# Patient Record
Sex: Male | Born: 1985 | Race: Black or African American | Hispanic: No | Marital: Married | Smoking: Never smoker
Health system: Southern US, Community
[De-identification: ages and names within clinical notes are randomized; demographics above are authoritative.]

## PROBLEM LIST (undated history)

## (undated) DIAGNOSIS — J45909 Unspecified asthma, uncomplicated: Secondary | ICD-10-CM

## (undated) DIAGNOSIS — M25871 Other specified joint disorders, right ankle and foot: Secondary | ICD-10-CM

## (undated) DIAGNOSIS — R112 Nausea with vomiting, unspecified: Secondary | ICD-10-CM

## (undated) DIAGNOSIS — Z9889 Other specified postprocedural states: Secondary | ICD-10-CM

## (undated) DIAGNOSIS — M958 Other specified acquired deformities of musculoskeletal system: Secondary | ICD-10-CM

## (undated) DIAGNOSIS — Z98811 Dental restoration status: Secondary | ICD-10-CM

## (undated) HISTORY — PX: WISDOM TOOTH EXTRACTION: SHX21

---
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
## 2006-11-27 ENCOUNTER — Emergency Department (HOSPITAL_COMMUNITY): Admission: EM | Admit: 2006-11-27 | Discharge: 2006-11-27 | Payer: Self-pay | Admitting: Emergency Medicine

## 2013-05-12 HISTORY — PX: ANKLE ARTHROSCOPY: SHX545

## 2014-05-12 HISTORY — PX: SHOULDER ARTHROSCOPY: SHX128

## 2017-01-10 DIAGNOSIS — M958 Other specified acquired deformities of musculoskeletal system: Secondary | ICD-10-CM

## 2017-01-10 DIAGNOSIS — M25871 Other specified joint disorders, right ankle and foot: Secondary | ICD-10-CM

## 2017-01-10 HISTORY — DX: Other specified acquired deformities of musculoskeletal system: M95.8

## 2017-01-10 HISTORY — DX: Other specified joint disorders, right ankle and foot: M25.871

## 2017-01-29 ENCOUNTER — Other Ambulatory Visit: Payer: Self-pay | Admitting: Orthopedic Surgery

## 2017-01-30 ENCOUNTER — Telehealth: Payer: Self-pay

## 2017-01-30 NOTE — Telephone Encounter (Signed)
Pre visit call attempted. Left SMS message for return call.

## 2017-02-02 ENCOUNTER — Encounter: Payer: Self-pay | Admitting: Family Medicine

## 2017-02-02 ENCOUNTER — Ambulatory Visit (INDEPENDENT_AMBULATORY_CARE_PROVIDER_SITE_OTHER): Payer: Managed Care, Other (non HMO) | Admitting: Family Medicine

## 2017-02-02 VITALS — BP 118/74 | HR 71 | Temp 98.5°F | Ht 73.0 in | Wt 270.1 lb

## 2017-02-02 DIAGNOSIS — Z0000 Encounter for general adult medical examination without abnormal findings: Secondary | ICD-10-CM

## 2017-02-02 LAB — LIPID PANEL
CHOL/HDL RATIO: 5
Cholesterol: 187 mg/dL (ref 0–200)
HDL: 35.6 mg/dL — ABNORMAL LOW (ref >39.00)
LDL Cholesterol: 134 mg/dL — ABNORMAL HIGH (ref 0–99)
NonHDL: 150.92
TRIGLYCERIDES: 87 mg/dL (ref 0.0–149.0)
VLDL: 17.4 mg/dL (ref 0.0–40.0)

## 2017-02-02 LAB — COMPREHENSIVE METABOLIC PANEL
ALBUMIN: 4.1 g/dL (ref 3.5–5.2)
ALT: 15 U/L (ref 0–53)
AST: 15 U/L (ref 0–37)
Alkaline Phosphatase: 41 U/L (ref 39–117)
BILIRUBIN TOTAL: 0.5 mg/dL (ref 0.2–1.2)
BUN: 15 mg/dL (ref 6–23)
CALCIUM: 8.9 mg/dL (ref 8.4–10.5)
CO2: 28 mEq/L (ref 19–32)
CREATININE: 1.15 mg/dL (ref 0.40–1.50)
Chloride: 108 mEq/L (ref 96–112)
GFR: 94.39 mL/min (ref >60.00)
Glucose, Bld: 88 mg/dL (ref 70–99)
Potassium: 3.8 mEq/L (ref 3.5–5.1)
Sodium: 144 mEq/L (ref 135–145)
Total Protein: 7.2 g/dL (ref 6.0–8.3)

## 2017-02-02 LAB — CBC
HCT: 43.3 % (ref 39.0–52.0)
HEMOGLOBIN: 14.4 g/dL (ref 13.0–17.0)
MCHC: 33.3 g/dL (ref 30.0–36.0)
MCV: 93.4 fl (ref 78.0–100.0)
PLATELETS: 218 10*3/uL (ref 150.0–400.0)
RBC: 4.63 Mil/uL (ref 4.22–5.81)
RDW: 13.3 % (ref 11.5–15.5)
WBC: 5.8 10*3/uL (ref 4.0–10.5)

## 2017-02-02 NOTE — Progress Notes (Signed)
Pre visit review using our clinic review tool, if applicable. No additional management support is needed unless otherwise documented below in the visit note. 

## 2017-02-02 NOTE — Progress Notes (Signed)
Chief Complaint  Patient presents with  . Establish Care    Well Male Nicholas Montgomery is here for a complete physical.   His last physical was >1 year ago.  Current diet: in general, a "healthy" diet   Current exercise: light lifting, cardio Weight trend: stable Does pt snore? No. Daytime fatigue? No. Seat belt? Yes.    Health maintenance Tetanus- Yes HIV- Yes  Past Medical History:  Diagnosis Date  . Allergy     Past Surgical History:  Procedure Laterality Date  . ANTERIOR CRUCIATE LIGAMENT REPAIR    . OTHER SURGICAL HISTORY  2003   Ankle  . OTHER SURGICAL HISTORY  2016   R shoulder  . OTHER SURGICAL HISTORY  2016   L shoulder  . OTHER SURGICAL HISTORY  2015   R ankle     Medications  Flonase Singulair  Allergies Allergies  Allergen Reactions  . Prednisone Other (See Comments)    Made him emotional  . Toradol [Ketorolac Tromethamine] Anxiety   Family History Family History  Problem Relation Age of Onset  . Hypertension Mother   . Hyperlipidemia Mother   . Fibromyalgia Mother   . Hyperlipidemia Father   . Hypertension Father   . Diabetes Father   . Allergies Daughter   . Allergies Son   . COPD Maternal Grandmother   . Heart disease Maternal Grandmother     Review of Systems: Constitutional:  no unexpected change in weight, no fevers or chills Eye:  no recent significant change in vision Ear/Nose/Mouth/Throat:  Ears:  no tinnitus or hearing loss Nose/Mouth/Throat: +nasal congestion, no ST Cardiovascular:  no chest pain, no palpitations Respiratory:  no cough and no shortness of breath Gastrointestinal:  no abdominal pain, no change in bowel habits, no nausea, vomiting, diarrhea, or constipation and no black or bloody stool GU:  Male: negative for dysuria, frequency, and incontinence and negative for prostate symptoms Musculoskeletal/Extremities:  no pain, redness, or swelling of the joints Integumentary (Skin/Breast): +lesions under R armpit-  otherwise no abnormal skin lesions reported Neurologic:  no headaches, no numbness, tingling Endocrine: No unexpected weight changes Hematologic/Lymphatic:  no abnormal bleeding  Exam BP 118/74 (BP Location: Left Arm, Patient Position: Sitting, Cuff Size: Large)   Pulse 71   Temp 98.5 F (36.9 C) (Oral)   Ht  (1.854 m)   Wt 270 lb 2 oz (122.5 kg)   SpO2 97%   BMI 35.64 kg/m  General:  well developed, well nourished, in no apparent distress Skin: Fleshy lesions appreciated in R axillary region; otherwise no significant moles, warts, or growths Head:  no masses, lesions, or tenderness Eyes:  pupils equal and round, sclera anicteric without injection Ears:  canals without lesions, TMs shiny without retraction, no obvious effusion, no erythema Nose:  nares patent, septum midline, mucosa normal Throat/Pharynx:  lips and gingiva without lesion; tongue and uvula midline; non-inflamed pharynx; no exudates or postnasal drainage Neck: neck supple without adenopathy, thyromegaly, or masses Lungs:  clear to auscultation, breath sounds equal bilaterally, no respiratory distress Cardio:  regular rate and rhythm without murmurs, heart sounds without clicks or rubs Abdomen:  abdomen soft, nontender; bowel sounds normal; no masses or organomegaly Genital (male): declined Rectal: Deferred Musculoskeletal:  symmetrical muscle groups noted without atrophy or deformity Extremities:  no clubbing, cyanosis, or edema, no deformities, no skin discoloration Neuro:  gait normal; deep tendon reflexes normal and symmetric Psych: well oriented with normal range of affect and appropriate judgment/insight  Assessment and  Plan  Well adult exam - Plan: Lipid panel, CBC, Comprehensive metabolic panel   Well 31 y.o. male. Counseled on diet and exercise. He is largely doing well.  Other orders as above. Check w insurance about skin tag removal. Will have him return for removal if he finds out it is  covered. Follow up in 1 year pending the above workup otherwise. The patient voiced understanding and agreement to the plan.  Jilda Roche Fraser, DO 02/02/17 8:39 AM

## 2017-02-02 NOTE — Patient Instructions (Signed)
Check with insurance if they cover painful skin tag removals. If they do, schedule an appt and we will take them off.  Give [place] 2-3 business days to get the results of your labs back. If labs are normal, you will likely receive a letter in the mail unless you have MyChart. This can take longer than 2-3 business days.   Let us know if you need anything.

## 2017-02-05 ENCOUNTER — Encounter (HOSPITAL_BASED_OUTPATIENT_CLINIC_OR_DEPARTMENT_OTHER): Payer: Self-pay | Admitting: *Deleted

## 2017-02-11 NOTE — Progress Notes (Signed)
Arrival time moved up per Dr. Victorino Dike. TM

## 2017-02-12 ENCOUNTER — Encounter (HOSPITAL_BASED_OUTPATIENT_CLINIC_OR_DEPARTMENT_OTHER): Payer: Self-pay | Admitting: Emergency Medicine

## 2017-02-12 ENCOUNTER — Ambulatory Visit (HOSPITAL_BASED_OUTPATIENT_CLINIC_OR_DEPARTMENT_OTHER): Payer: Managed Care, Other (non HMO) | Admitting: Certified Registered"

## 2017-02-12 ENCOUNTER — Encounter (HOSPITAL_BASED_OUTPATIENT_CLINIC_OR_DEPARTMENT_OTHER)
Admission: RE | Disposition: A | Discharge status: Home / Self Care | Payer: Self-pay | Source: Ambulatory Visit | Attending: Orthopedic Surgery

## 2017-02-12 ENCOUNTER — Ambulatory Visit (HOSPITAL_BASED_OUTPATIENT_CLINIC_OR_DEPARTMENT_OTHER)
Admission: RE | Admit: 2017-02-12 | Discharge: 2017-02-12 | Disposition: A | Discharge status: Home / Self Care | Payer: Managed Care, Other (non HMO) | Source: Ambulatory Visit | Attending: Orthopedic Surgery | Admitting: Orthopedic Surgery

## 2017-02-12 DIAGNOSIS — J45909 Unspecified asthma, uncomplicated: Secondary | ICD-10-CM | POA: Diagnosis not present

## 2017-02-12 DIAGNOSIS — M25871 Other specified joint disorders, right ankle and foot: Secondary | ICD-10-CM | POA: Insufficient documentation

## 2017-02-12 DIAGNOSIS — M216X1 Other acquired deformities of right foot: Secondary | ICD-10-CM | POA: Insufficient documentation

## 2017-02-12 DIAGNOSIS — Z79899 Other long term (current) drug therapy: Secondary | ICD-10-CM | POA: Diagnosis not present

## 2017-02-12 DIAGNOSIS — Z888 Allergy status to other drugs, medicaments and biological substances status: Secondary | ICD-10-CM | POA: Insufficient documentation

## 2017-02-12 DIAGNOSIS — M65871 Other synovitis and tenosynovitis, right ankle and foot: Secondary | ICD-10-CM | POA: Insufficient documentation

## 2017-02-12 HISTORY — DX: Other specified joint disorders, right ankle and foot: M25.871

## 2017-02-12 HISTORY — DX: Other specified acquired deformities of musculoskeletal system: M95.8

## 2017-02-12 HISTORY — DX: Other specified postprocedural states: Z98.890

## 2017-02-12 HISTORY — DX: Unspecified asthma, uncomplicated: J45.909

## 2017-02-12 HISTORY — DX: Dental restoration status: Z98.811

## 2017-02-12 HISTORY — PX: ANKLE ARTHROSCOPY WITH DRILLING/MICROFRACTURE: SHX5580

## 2017-02-12 HISTORY — DX: Other specified postprocedural states: R11.2

## 2017-02-12 SURGERY — ARTHROSCOPY, ANKLE, WITH MICROFRACTURE
Anesthesia: General | Site: Ankle | Laterality: Right

## 2017-02-12 MED ORDER — SCOPOLAMINE 1 MG/3DAYS TD PT72
MEDICATED_PATCH | TRANSDERMAL | Status: AC
  Filled 2017-02-12: qty 1

## 2017-02-12 MED ORDER — LIDOCAINE HCL (CARDIAC) 20 MG/ML IV SOLN
INTRAVENOUS | Status: DC | PRN
  Administered 2017-02-12: 60 mg via INTRAVENOUS

## 2017-02-12 MED ORDER — CEFAZOLIN SODIUM-DEXTROSE 2-4 GM/100ML-% IV SOLN
INTRAVENOUS | Status: AC
  Filled 2017-02-12: qty 100

## 2017-02-12 MED ORDER — ONDANSETRON HCL 4 MG/2ML IJ SOLN
INTRAMUSCULAR | Status: DC | PRN
  Administered 2017-02-12: 4 mg via INTRAVENOUS

## 2017-02-12 MED ORDER — PROMETHAZINE HCL 25 MG/ML IJ SOLN: 6.2500 mg | INTRAMUSCULAR | Status: DC | PRN

## 2017-02-12 MED ORDER — FENTANYL CITRATE (PF) 100 MCG/2ML IJ SOLN
INTRAMUSCULAR | Status: AC
  Filled 2017-02-12: qty 2

## 2017-02-12 MED ORDER — DEXTROSE 5 % IV SOLN
3.0000 g | INTRAVENOUS | Status: AC
  Administered 2017-02-12: 3 g via INTRAVENOUS

## 2017-02-12 MED ORDER — FENTANYL CITRATE (PF) 100 MCG/2ML IJ SOLN
50.0000 ug | INTRAMUSCULAR | Status: DC | PRN
  Administered 2017-02-12: 100 ug via INTRAVENOUS

## 2017-02-12 MED ORDER — LACTATED RINGERS IV SOLN
INTRAVENOUS | Status: DC
  Administered 2017-02-12 (×2): via INTRAVENOUS

## 2017-02-12 MED ORDER — FENTANYL CITRATE (PF) 100 MCG/2ML IJ SOLN
25.0000 ug | INTRAMUSCULAR | Status: DC | PRN
  Administered 2017-02-12 (×2): 50 ug via INTRAVENOUS

## 2017-02-12 MED ORDER — BUPIVACAINE-EPINEPHRINE (PF) 0.5% -1:200000 IJ SOLN
INTRAMUSCULAR | Status: DC | PRN
  Administered 2017-02-12: 40 mL via PERINEURAL

## 2017-02-12 MED ORDER — CEFAZOLIN SODIUM-DEXTROSE 1-4 GM/50ML-% IV SOLN
INTRAVENOUS | Status: AC
  Filled 2017-02-12: qty 50

## 2017-02-12 MED ORDER — PROPOFOL 10 MG/ML IV BOLUS
INTRAVENOUS | Status: DC | PRN
  Administered 2017-02-12: 200 mg via INTRAVENOUS

## 2017-02-12 MED ORDER — CEFAZOLIN SODIUM-DEXTROSE 2-4 GM/100ML-% IV SOLN: 2.0000 g | INTRAVENOUS | Status: DC

## 2017-02-12 MED ORDER — MIDAZOLAM HCL 2 MG/2ML IJ SOLN
INTRAMUSCULAR | Status: AC
  Filled 2017-02-12: qty 2

## 2017-02-12 MED ORDER — SODIUM CHLORIDE 0.9 % IR SOLN
Status: DC | PRN
  Administered 2017-02-12: 1000 mL

## 2017-02-12 MED ORDER — SCOPOLAMINE 1 MG/3DAYS TD PT72
1.0000 | MEDICATED_PATCH | Freq: Once | TRANSDERMAL | Status: DC | PRN
  Administered 2017-02-12: 1.5 mg via TRANSDERMAL

## 2017-02-12 MED ORDER — DEXAMETHASONE SODIUM PHOSPHATE 10 MG/ML IJ SOLN
INTRAMUSCULAR | Status: DC | PRN
  Administered 2017-02-12: 5 mg via INTRAVENOUS

## 2017-02-12 MED ORDER — MIDAZOLAM HCL 2 MG/2ML IJ SOLN
1.0000 mg | INTRAMUSCULAR | Status: DC | PRN
  Administered 2017-02-12: 2 mg via INTRAVENOUS

## 2017-02-12 MED ORDER — CHLORHEXIDINE GLUCONATE 4 % EX LIQD: 60.0000 mL | Freq: Once | CUTANEOUS | Status: DC

## 2017-02-12 MED ORDER — SODIUM CHLORIDE 0.9 % IV SOLN: INTRAVENOUS | Status: DC

## 2017-02-12 SURGICAL SUPPLY — 80 items
BANDAGE ESMARK 6X9 LF (GAUZE/BANDAGES/DRESSINGS) IMPLANT
BLADE CUDA 2.0 (BLADE) IMPLANT
BLADE CUDA GRT WHITE 3.5 (BLADE) IMPLANT
BLADE CUDA SHAVER 3.5 (BLADE) IMPLANT
BLADE CUTTER GATOR 3.5 (BLADE) ×3 IMPLANT
BLADE SURG 15 STRL LF DISP TIS (BLADE) ×1 IMPLANT
BLADE SURG 15 STRL SS (BLADE) ×2
BNDG COHESIVE 4X5 TAN STRL (GAUZE/BANDAGES/DRESSINGS) IMPLANT
BNDG COHESIVE 6X5 TAN STRL LF (GAUZE/BANDAGES/DRESSINGS) ×3 IMPLANT
BNDG ESMARK 6X9 LF (GAUZE/BANDAGES/DRESSINGS)
BOOT STEPPER DURA LG (SOFTGOODS) IMPLANT
BOOT STEPPER DURA MED (SOFTGOODS) IMPLANT
BOOT STEPPER DURA SM (SOFTGOODS) IMPLANT
BUR 3.5 LG SPHERICAL (BURR) IMPLANT
BUR CUDA 2.9 (BURR) IMPLANT
BUR CUDA 2.9MM (BURR)
BUR FULL RADIUS 2.9 (BURR) IMPLANT
BUR FULL RADIUS 2.9MM (BURR)
BUR GATOR 2.9 (BURR) IMPLANT
BUR GATOR 2.9MM (BURR)
BUR OVAL 4.0 (BURR) ×3 IMPLANT
BUR SPHERICAL 2.9 (BURR) IMPLANT
BUR SPHERICAL 2.9MM (BURR)
BUR VERTEX HOODED 4.5 (BURR) IMPLANT
BURR 3.5 LG SPHERICAL (BURR)
BURR 3.5MM LG SPHERICAL (BURR)
CHLORAPREP W/TINT 26ML (MISCELLANEOUS) ×3 IMPLANT
CUFF TOURNIQUET SINGLE 34IN LL (TOURNIQUET CUFF) IMPLANT
CUFF TOURNIQUET SINGLE 44IN (TOURNIQUET CUFF) ×3 IMPLANT
DRAPE EXTREMITY T 121X128X90 (DRAPE) ×3 IMPLANT
DRAPE OEC MINIVIEW 54X84 (DRAPES) ×3 IMPLANT
DRAPE U-SHAPE 47X51 STRL (DRAPES) ×3 IMPLANT
DRSG MEPITEL 4X7.2 (GAUZE/BANDAGES/DRESSINGS) ×3 IMPLANT
DRSG PAD ABDOMINAL 8X10 ST (GAUZE/BANDAGES/DRESSINGS) IMPLANT
ELECT REM PT RETURN 9FT ADLT (ELECTROSURGICAL) ×3
ELECTRODE REM PT RTRN 9FT ADLT (ELECTROSURGICAL) ×1 IMPLANT
GAUZE SPONGE 4X4 12PLY STRL (GAUZE/BANDAGES/DRESSINGS) ×3 IMPLANT
GLOVE BIO SURGEON STRL SZ8 (GLOVE) ×3 IMPLANT
GLOVE BIOGEL PI IND STRL 7.0 (GLOVE) ×2 IMPLANT
GLOVE BIOGEL PI IND STRL 8 (GLOVE) ×3 IMPLANT
GLOVE BIOGEL PI INDICATOR 7.0 (GLOVE) ×4
GLOVE BIOGEL PI INDICATOR 8 (GLOVE) ×6
GLOVE ECLIPSE 7.0 STRL STRAW (GLOVE) ×3 IMPLANT
GLOVE ECLIPSE 8.0 STRL XLNG CF (GLOVE) ×6 IMPLANT
GOWN STRL REUS W/ TWL LRG LVL3 (GOWN DISPOSABLE) ×1 IMPLANT
GOWN STRL REUS W/ TWL XL LVL3 (GOWN DISPOSABLE) ×4 IMPLANT
GOWN STRL REUS W/TWL LRG LVL3 (GOWN DISPOSABLE) ×2
GOWN STRL REUS W/TWL XL LVL3 (GOWN DISPOSABLE) ×8
IMPLANT SCP KIT FOOT ANKLE 3 (Orthopedic Implant) ×3 IMPLANT
KIT ACCUFILL 3CC (Orthopedic Implant) ×1 IMPLANT
MANIFOLD NEPTUNE II (INSTRUMENTS) ×3 IMPLANT
PACK ARTHROSCOPY DSU (CUSTOM PROCEDURE TRAY) ×3 IMPLANT
PACK BASIN DAY SURGERY FS (CUSTOM PROCEDURE TRAY) ×3 IMPLANT
PAD CAST 4YDX4 CTTN HI CHSV (CAST SUPPLIES) ×1 IMPLANT
PADDING CAST ABS 4INX4YD NS (CAST SUPPLIES)
PADDING CAST ABS COTTON 4X4 ST (CAST SUPPLIES) IMPLANT
PADDING CAST COTTON 4X4 STRL (CAST SUPPLIES) ×2
PADDING CAST COTTON 6X4 STRL (CAST SUPPLIES) ×3 IMPLANT
PENCIL BUTTON HOLSTER BLD 10FT (ELECTRODE) IMPLANT
SANITIZER HAND PURELL 535ML FO (MISCELLANEOUS) ×3 IMPLANT
SET ARTHROSCOPY TUBING (MISCELLANEOUS) ×2
SET ARTHROSCOPY TUBING LN (MISCELLANEOUS) ×1 IMPLANT
SET IRRIG Y TYPE TUR BLADDER L (SET/KITS/TRAYS/PACK) IMPLANT
SLEEVE SCD COMPRESS KNEE MED (MISCELLANEOUS) ×3 IMPLANT
SPLINT FAST PLASTER 5X30 (CAST SUPPLIES) ×40
SPLINT PLASTER CAST FAST 5X30 (CAST SUPPLIES) ×20 IMPLANT
SPONGE LAP 18X18 X RAY DECT (DISPOSABLE) ×3 IMPLANT
STOCKINETTE 6  STRL (DRAPES) ×2
STOCKINETTE 6 STRL (DRAPES) ×1 IMPLANT
STRAP ANKLE FOOT DISTRACTOR (ORTHOPEDIC SUPPLIES) ×3 IMPLANT
SUT ETHILON 3 0 PS 1 (SUTURE) ×3 IMPLANT
SUT MNCRL AB 3-0 PS2 18 (SUTURE) IMPLANT
SUT VIC AB 0 SH 27 (SUTURE) IMPLANT
SUT VIC AB 2-0 SH 27 (SUTURE)
SUT VIC AB 2-0 SH 27XBRD (SUTURE) IMPLANT
SYR BULB 3OZ (MISCELLANEOUS) IMPLANT
TOWEL OR 17X24 6PK STRL BLUE (TOWEL DISPOSABLE) ×3 IMPLANT
TUBE CONNECTING 20'X1/4 (TUBING)
TUBE CONNECTING 20X1/4 (TUBING) IMPLANT
WATER STERILE IRR 1000ML POUR (IV SOLUTION) ×3 IMPLANT

## 2017-02-12 NOTE — Anesthesia Procedure Notes (Signed)
Anesthesia Regional Block: Popliteal block   Pre-Anesthetic Checklist: ,, timeout performed, Correct Patient, Correct Site, Correct Laterality, Correct Procedure, Correct Position, site marked, Risks and benefits discussed,  Surgical consent,  Pre-op evaluation,  At surgeon's request and post-op pain management  Laterality: Right  Prep: chloraprep       Needles:  Injection technique: Single-shot  Needle Type: Echogenic Needle     Needle Length: 9cm  Needle Gauge: 21     Additional Needles:   Procedures:,,,, ultrasound used (permanent image in chart),,,,  Narrative:  Start time: 02/12/2017 10:54 AM End time: 02/12/2017 11:02 AM Injection made incrementally with aspirations every 5 mL.  Performed by: Personally  Anesthesiologist: Cecile Hearing  Additional Notes: No pain on injection. No increased resistance to injection. Injection made in 5cc increments.  Good needle visualization.  Patient tolerated procedure well.

## 2017-02-12 NOTE — H&P (Signed)
Nicholas Montgomery is an 31 y.o. male.   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  He has failed non op treatment of the right ankle with bracing, activity modification and oral anti-inflammatory medications.  He presents today for right ankle arthroscopy with debridement and treatment of the osteochondral defect.  Past Medical History:  Diagnosis Date  . Allergy-induced asthma    prn inhaler  . Ankle impingement syndrome, right 01/2017  . Dental crowns present   . 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 PONV (postoperative nausea and vomiting)    severe nausea, per pt.    Past Surgical History:  Procedure Laterality Date  . ANKLE ARTHROSCOPY Right 2015  . ANTERIOR CRUCIATE LIGAMENT REPAIR Right 2003  . SHOULDER ARTHROSCOPY Left 2016  . SHOULDER ARTHROSCOPY Right 2016  . WISDOM TOOTH EXTRACTION      Family History  Problem Relation Age of Onset  . Hypertension Mother   . Hyperlipidemia Mother   . Fibromyalgia Mother   . Hyperlipidemia Father   . Hypertension Father   . Diabetes Father   . Allergies Daughter   . Allergies Son   . COPD Maternal Grandmother   . Heart disease Maternal Grandmother    Social History:  reports that he has never smoked. He quit smokeless tobacco use about 4 years ago. He reports that he drinks alcohol. He reports that he does not use drugs.  Allergies:  Allergies  Allergen Reactions  . Prednisone Anxiety  . Toradol [Ketorolac Tromethamine] Anxiety    Medications Prior to Admission  Medication Sig Dispense Refill  . albuterol (PROVENTIL HFA;VENTOLIN HFA) 108 (90 Base) MCG/ACT inhaler Inhale into the lungs every 6 (six) hours as needed for wheezing or shortness of breath.    . fluticasone (FLONASE) 50 MCG/ACT nasal spray Place 2 sprays into both nostrils daily.     Marland Kitchen ibuprofen (ADVIL,MOTRIN) 800 MG tablet Take 800 mg by mouth  every 8 (eight) hours as needed.    . montelukast (SINGULAIR) 10 MG tablet Take 10 mg by mouth at bedtime.      No results found for this or any previous visit (from the past 48 hour(s)). No results found.  ROS  No recent f/c/n/v/wt loss  Blood pressure (!) 133/92, pulse 70, temperature 98.2 F (36.8 C), temperature source Oral, resp. rate (!) 22, height  (1.854 m), weight 122 kg (269 lb), SpO2 100 %. Physical Exam  wn wd male in nad.  A and O x 4.  Mood and affect normal.  EOMI.  resp unlabored.  R ankle with healed surgical incisions and o/w healthy skin.  Sens to LT normal at the foot.  5/5 strength in PF and DF of the ankle and toes.  Brisk cap refill at the toes.  No lymphadenopathy.  Assessment/Plan R ankle synovitis and osteochondral defect of the talus - to OR for operative treatment.  The risks and benefits of the alternative treatment options have been discussed in detail.  The patient wishes to proceed with surgery and specifically understands risks of bleeding, infection, nerve damage, blood clots, need for additional surgery, amputation and death.   Toni Arthurs, MD 03/12/2017, 11:14 AM

## 2017-02-12 NOTE — Discharge Instructions (Signed)
Post Anesthesia Home Care Instructions  Activity: Get plenty of rest for the remainder of the day. A responsible individual must stay with you for 24 hours following the procedure.  For the next 24 hours, DO NOT: -Drive a car -Advertising copywriter -Drink alcoholic beverages -Take any medication unless instructed by your physician -Make any legal decisions or sign important papers.  Meals: Start with liquid foods such as gelatin or soup. Progress to regular foods as tolerated. Avoid greasy, spicy, heavy foods. If nausea and/or vomiting occur, drink only clear liquids until the nausea and/or vomiting subsides. Call your physician if vomiting continues.  Special Instructions/Symptoms: Your throat may feel dry or sore from the anesthesia or the breathing tube placed in your throat during surgery. If this causes discomfort, gargle with warm salt water. The discomfort should disappear within 24 hours.  If you had a scopolamine patch placed behind your ear for the management of post- operative nausea and/or vomiting:  1. The medication in the patch is effective for 72 hours, after which it should be removed.  Wrap patch in a tissue and discard in the trash. Wash hands thoroughly with soap and water. 2. You may remove the patch earlier than 72 hours if you experience unpleasant side effects which may include dry mouth, dizziness or visual disturbances. 3. Avoid touching the patch. Wash your hands with soap and water after contact with the patch.  Toni Arthurs, MD [facility]  Please read the following information regarding your care after surgery.  Medications  You only need a prescription for the narcotic pain medicine (ex. oxycodone, Percocet, Norco).  All of the other medicines listed below are available over the counter. X Aleve 2 pills twice a day for the first 3 days after surgery. X acetominophen (Tylenol) 650 mg every 4-6 hours as you need for minor to moderate pain X  oxycodone as prescribed for severe pain  Narcotic pain medicine (ex. oxycodone, Percocet, Vicodin) will cause constipation.  To prevent this problem, take the following medicines while you are taking any pain medicine. X docusate sodium (Colace) 100 mg twice a day X senna (Senokot) 2 tablets twice a day  X To help prevent blood clots, take a baby aspirin (81 mg) twice a day for two weeks after surgery.  You should also get up every hour while you are awake to move around.    Weight Bearing ? Bear weight when you are able on your operated leg or foot. ? Bear weight only on your operated foot in the post-op shoe. X Do not bear any weight on the operated leg or foot.  Cast / Splint / Dressing X Keep your splint, cast or dressing clean and dry.  Dont put anything (coat hanger, pencil, etc) down inside of it.  If it gets damp, use a hair dryer on the cool setting to dry it.  If it gets soaked, call the office to schedule an appointment for a cast change. ? Remove your dressing 3 days after surgery and cover the incisions with dry dressings.    After your dressing, cast or splint is removed; you may shower, but do not soak or scrub the wound.  Allow the water to run over it, and then gently pat it dry.  Swelling It is normal for you to have swelling where you had surgery.  To reduce swelling and pain, keep your toes above your nose for at least 3 days after surgery.  It may be necessary to keep  your foot or leg elevated for several weeks.  If it hurts, it should be elevated.  Follow Up Call my office at [phone] when you are discharged from the hospital or surgery center to schedule an appointment to be seen two weeks after surgery.  Call my office at [phone] if you develop a fever >101.5 F, nausea, vomiting, bleeding from the surgical site or severe pain.

## 2017-02-12 NOTE — Transfer of Care (Signed)
Immediate Anesthesia Transfer of Care Note  Patient: Nicholas Montgomery  Procedure(s) Performed: ANKLE ARTHROSCOPY WITH EXTENSIVE DEBRIDEMENT AND SUBCHONDROPLASTY (Right Ankle)  Patient Location: PACU  Anesthesia Type:GA combined with regional for post-op pain  Level of Consciousness: awake and patient cooperative  Airway & Oxygen Therapy: Patient Spontanous Breathing and Patient connected to face mask oxygen  Post-op Assessment: Report given to RN and Post -op Vital signs reviewed and stable  Post vital signs: Reviewed and stable  Last Vitals:  Vitals:   02/12/17 1102 02/12/17 1256  BP: (!) 133/92 (P) 135/78  Pulse: 70 (P) 85  Resp: (!) 22 (P) 16  Temp:  (!) (P) 36.3 C  SpO2: 100% (P) 100%    Last Pain:  Vitals:   02/12/17 1102  TempSrc:   PainSc: 0-No pain         Complications: No apparent anesthesia complications

## 2017-02-12 NOTE — Progress Notes (Signed)
Assisted Dr. Turk with right, ultrasound guided, popliteal block. Side rails up, monitors on throughout procedure. See vital signs in flow sheet. Tolerated Procedure well. 

## 2017-02-12 NOTE — Anesthesia Procedure Notes (Signed)
Procedure Name: LMA Insertion Date/Time: 02/12/2017 11:36 AM Performed by: Palmer Fahrner D Pre-anesthesia Checklist: Patient identified, Emergency Drugs available, Suction available and Patient being monitored Patient Re-evaluated:Patient Re-evaluated prior to induction Oxygen Delivery Method: Circle system utilized Preoxygenation: Pre-oxygenation with 100% oxygen Induction Type: IV induction Ventilation: Mask ventilation without difficulty LMA: LMA inserted LMA Size: 4.0 Number of attempts: 1 Airway Equipment and Method: Bite block Placement Confirmation: positive ETCO2 Tube secured with: Tape Dental Injury: Teeth and Oropharynx as per pre-operative assessment

## 2017-02-12 NOTE — Anesthesia Postprocedure Evaluation (Signed)
Anesthesia Post Note  Patient: Nicholas Montgomery  Procedure(s) Performed: ANKLE ARTHROSCOPY WITH EXTENSIVE DEBRIDEMENT AND SUBCHONDROPLASTY (Right Ankle)     Patient location during evaluation: PACU Anesthesia Type: General Level of consciousness: awake and alert Pain management: pain level controlled Vital Signs Assessment: post-procedure vital signs reviewed and stable Respiratory status: spontaneous breathing, nonlabored ventilation and respiratory function stable Cardiovascular status: blood pressure returned to baseline and stable Postop Assessment: no apparent nausea or vomiting Anesthetic complications: no    Last Vitals:  Vitals:   02/12/17 1345 02/12/17 1358  BP:  124/90  Pulse: 70 65  Resp: 15 16  Temp:  36.4 C  SpO2: 98% 98%    Last Pain:  Vitals:   02/12/17 1345  TempSrc:   PainSc: 2                  Cecile Hearing

## 2017-02-12 NOTE — Anesthesia Preprocedure Evaluation (Signed)
Anesthesia Evaluation  Patient identified by MRN, date of birth, ID band Patient awake    Reviewed: Allergy & Precautions, NPO status , Patient's Chart, lab work & pertinent test results  History of Anesthesia Complications (+) PONV and history of anesthetic complications  Airway Mallampati: I  TM Distance: >3 FB Neck ROM: Full    Dental  (+) Teeth Intact, Dental Advisory Given   Pulmonary asthma ,    Pulmonary exam normal breath sounds clear to auscultation       Cardiovascular negative cardio ROS Normal cardiovascular exam Rhythm:Regular Rate:Normal     Neuro/Psych negative neurological ROS  negative psych ROS   GI/Hepatic negative GI ROS, Neg liver ROS,   Endo/Other  negative endocrine ROSObesity   Renal/GU negative Renal ROS     Musculoskeletal negative musculoskeletal ROS (+)   Abdominal   Peds  Hematology negative hematology ROS (+)   Anesthesia Other Findings Day of surgery medications reviewed with the patient.  Reproductive/Obstetrics                             Anesthesia Physical Anesthesia Plan  ASA: II  Anesthesia Plan: General   Post-op Pain Management:  Regional for Post-op pain   Induction: Intravenous  PONV Risk Score and Plan: 3 and Ondansetron, Dexamethasone, Midazolam and Scopolamine patch - Pre-op  Airway Management Planned: LMA  Additional Equipment:   Intra-op Plan:   Post-operative Plan: Extubation in OR  Informed Consent: I have reviewed the patients History and Physical, chart, labs and discussed the procedure including the risks, benefits and alternatives for the proposed anesthesia with the patient or authorized representative who has indicated his/her understanding and acceptance.   Dental advisory given  Plan Discussed with: CRNA  Anesthesia Plan Comments: (Risks/benefits of general anesthesia discussed with patient including risk of  damage to teeth, lips, gum, and tongue, nausea/vomiting, allergic reactions to medications, and the possibility of heart attack, stroke and death.  All patient questions answered.  Patient wishes to proceed.)        Anesthesia Quick Evaluation

## 2017-02-12 NOTE — Brief Op Note (Signed)
02/12/2017  1:08 PM  PATIENT:  Nicholas Montgomery  31 y.o. male  PRE-OPERATIVE DIAGNOSIS:   1.  Right ankle anterior impingement      2.  osteochondral defect right lateral talus       POST-OPERATIVE DIAGNOSIS:  1.  Right ankle anterior impingement      2.  osteochondral defect right lateral talus      3.  Right ankle synovitis  Procedure(s): 1.  Right ANKLE ARTHROSCOPY WITH EXTENSIVE DEBRIDEMENT 2.  Arthroscopic treatment of right talus osteochodral defect  SURGEON:  Toni Arthurs, MD  ASSISTANT: Alfredo Martinez, PA-C  ANESTHESIA:   General, regional  EBL:  minimal   TOURNIQUET:   Total Tourniquet Time Documented: Thigh (Right) - 53 minutes Total: Thigh (Right) - 53 minutes  COMPLICATIONS:  None apparent  DISPOSITION:  Extubated, awake and stable to recovery.  [id number]

## 2017-02-12 NOTE — Op Note (Signed)
[name], [name] NO.:  [id number]  MEDICAL RECORD NO.:  [id number]  LOCATION:                                 FACILITY:  PHYSICIAN:  Toni Arthurs, MD             DATE OF BIRTH:  DATE OF PROCEDURE:  02/12/2017 DATE OF DISCHARGE:                              OPERATIVE REPORT   PREOPERATIVE DIAGNOSES: 1. Right ankle anterior impingement. 2. 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  POSTOPERATIVE DIAGNOSES: 1. Right ankle anterior impingement. 2. 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 3. Right ankle synovitis of the medial, lateral, and anterior gutters.  PROCEDURE: 1. Right ankle arthroscopy with extensive debridement including     medial, lateral, and anterior gutters as well as the impinging     talar osteophyte. 2. Arthroscopic treatment of the right talus osteochondral defect. 3.  AP and lateral radiographs of the right ankle  SURGEON:  Toni Arthurs, MD.  ASSISTANT:  Alfredo Martinez, PA-C.  ANESTHESIA:  General, regional.  ESTIMATED BLOOD LOSS:  Minimal  TOURNIQUET TIME:  53 minutes at 250 mmHg.  COMPLICATIONS:  None apparent.  DISPOSITION:  Extubated, awake, and stable to recovery.  INDICATIONS FOR PROCEDURE:  The patient is a 31 year old male with a history of right ankle pain.  He underwent microfracture of the osteochondral lesion of the lateral talar dome approximately 3 years ago.  He has an impinging osteophyte at the neck of the talus seen on x- ray and on MRI.  He also has persistence of the osteochondral defect with some edema within the lateral talar dome.  He has failed nonoperative treatment to date.  He presents today for ankle arthroscopy and debridement as well as treatment of the osteochondral lesion.  He understands the risks and benefits of the alternative treatment options and elects surgical treatment.  He specifically understands risks of bleeding, infection, nerve damage, blood  clots, need for additional surgery, continued pain, posttraumatic arthritis, amputation, and death.  PROCEDURE IN DETAIL:  After preoperative consent was obtained and the correct operative site was identified, the patient was brought to the operating room and placed supine on the operating table.  General anesthesia was induced.  Preoperative antibiotics were administered. Surgical time-out was taken.  The right lower extremity was prepped and draped in standard sterile fashion with a tourniquet around the thigh. The extremity was positioned in a well leg holder with a traction strap on the right foot.  The extremity was exsanguinated and the tourniquet was inflated to 250 mmHg.  The patient's previous anteromedial arthroscopy portal was identified.  It was opened again sharply and blunt dissection was carried to the level of the joint.  The arthroscopic trocar was inserted and followed by the camera.  The camera was then utilized to establish an anterolateral portal under direct vision using the nick and spread technique.  The joint was then carefully evaluated.  There was significant synovitis noted at the anterolateral and anterior gutter.  The lateral gutter was noted to have an osteochondral lesion of the lateral talar dome with some loose cartilage in the lateral gutter.  The shaver was  used to resect the synovitis at the anterior and lateral gutter and also the loose cartilage at the osteochondral lesion.  The lesion was then probed. There was no other evidence of exposed bone or unstable cartilage. There was good coverage of the lesion with fiber cartilage.  The remainder of the talar dome as well as the tibial plafond were noted to have healthy and intact cartilage.  The posterior gutter was noted to have no synovitis or loose body.  The medial gutter was noted to have no loose body or synovitis.  The anteromedial joint line was noted to have significant synovitis.  This was  resected with the shaver.  The traction strap was then released in order to visualize the anterior gutter.  The osteophyte was identified at the anteromedial neck of the talus.  The arthroscope was switched to the lateral portal.  A bur was placed through the medial portal and was used to resect the osteophyte to the level of the neck of the talus.  The arthroscopic instruments were then removed.  AP and lateral radiographs confirmed the location of the osteochondral lesion.  A guidepin was inserted from the medial process of the talus.  It was advanced to the osteochondral lesion at the subchondral area of the lateral talar dome.  Appropriate positioning of the cannula was noted on AP and lateral radiographs.  Calcium phosphate liquid cement was then inserted through the cannula.  Approximately 2 mL were used to fill in the area of bone marrow edema.  AP and lateral radiographs showed appropriate filling of the defect.  The appropriate amount of time was allowed to elapse.  The cannula was removed.  The arthroscope was inserted back in the ankle joint, and there was no evidence of extrusion of the calcium phosphate into the joint.  Arthroscopic instruments were again removed.  Portals were closed with nylon.  A stab incision was closed with nylon.  Sterile dressings were applied, followed by a well- padded short-leg splint.  Tourniquet was released after application of the dressings at 53 minutes.  The patient was awakened from anesthesia and transported to the recovery room in stable condition.  FOLLOWUP PLAN:  The patient will be nonweightbearing on the right lower extremity for the next 2 weeks.  He will follow up with me for suture removal and to initiate weightbearing in a CAM boot along with active range of motion.  Alfredo Martinez, PA-C, was present and scrubbed for the duration of the case.  His assistance was essential for positioning the patient, prepping and draping, gaining  and maintaining exposure, manipulating the arthroscopic instruments, closing and dressing the wounds, and applying the splint.  RADIOGRAPHS:  AP and lateral radiographs of the right ankle were obtained intraoperatively.  These show appropriate position of the cannula and appropriate filling of the osteochondral defect.  No other acute injuries are noted.  Interval resection of the talar neck osteophyte is also noted.     Toni Arthurs, MD     JH/MEDQ  D:  02/12/2017  T:  02/12/2017  Job:  [id number]

## 2017-02-13 ENCOUNTER — Encounter (HOSPITAL_BASED_OUTPATIENT_CLINIC_OR_DEPARTMENT_OTHER): Payer: Self-pay | Admitting: Orthopedic Surgery

## 2017-04-09 ENCOUNTER — Other Ambulatory Visit: Payer: Self-pay

## 2017-04-09 ENCOUNTER — Encounter: Payer: Self-pay | Admitting: Physical Therapy

## 2017-04-09 ENCOUNTER — Ambulatory Visit: Payer: Managed Care, Other (non HMO) | Attending: Orthopedic Surgery | Admitting: Physical Therapy

## 2017-04-09 DIAGNOSIS — M25571 Pain in right ankle and joints of right foot: Secondary | ICD-10-CM

## 2017-04-09 DIAGNOSIS — M25671 Stiffness of right ankle, not elsewhere classified: Secondary | ICD-10-CM

## 2017-04-09 DIAGNOSIS — R29898 Other symptoms and signs involving the musculoskeletal system: Secondary | ICD-10-CM

## 2017-04-09 DIAGNOSIS — R2689 Other abnormalities of gait and mobility: Secondary | ICD-10-CM | POA: Diagnosis present

## 2017-04-09 NOTE — Therapy (Signed)
[facility] [address]  [address] [place], [place], [address] Phone: [phone]   Fax:  [phone]  Physical Therapy Evaluation  Patient Details  Name: Nicholas Montgomery MRN: [id number] Date of Birth: [birth year] Referring Provider: Alfredo Martinez - [facility]   Encounter Date: 04/09/2017  PT End of Session - 04/09/17 1155    Visit Number  1    Number of Visits  12    Date for PT Re-Evaluation  05/21/17    PT Start Time  1019    PT Stop Time  1102    PT Time Calculation (min)  43 min    Activity Tolerance  Patient tolerated treatment well    Behavior During Therapy  [facility] for tasks assessed/performed       Past Medical History:  Diagnosis Date  . Allergy-induced asthma    prn inhaler  . Ankle impingement syndrome, right 01/2017  . Dental crowns present   . 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 PONV (postoperative nausea and vomiting)    severe nausea, per pt.    Past Surgical History:  Procedure Laterality Date  . ANKLE ARTHROSCOPY Right 2015  . ANKLE ARTHROSCOPY WITH DRILLING/MICROFRACTURE Right 02/12/2017   Procedure: ANKLE ARTHROSCOPY WITH EXTENSIVE DEBRIDEMENT AND SUBCHONDROPLASTY;  Surgeon: Toni Arthurs, MD;  Location: [facility];  Service: Orthopedics;  Laterality: Right;  requests  . ANTERIOR CRUCIATE LIGAMENT REPAIR Right 2003  . SHOULDER ARTHROSCOPY Left 2016  . SHOULDER ARTHROSCOPY Right 2016  . WISDOM TOOTH EXTRACTION      There were no vitals filed for this visit.   Subjective Assessment - 04/09/17 1111    Subjective  Patient [place] ankle [place]. He was in a boot for 8 months following initial injury before having surgery. Following surgery, patient was in a cast for 8 weeks and a boot again. Patient had one visit of PT after coming out of the boot. Since then, patient has adopted an abnormal gait pattern with which he feels he  constantly walks on his heels and is now developing foot pain due to gait pattern. Patient had R ankle arthroscopy in October for removal of a bone spur. Patient doing well since surgery with strength and motion, however still feels he is walking abnormally and would like to correct this as well as improve his strength and balance in order to be able to return to normal sport and gym activities.     Limitations  Standing;Walking    How long can you stand comfortably?  pain immediately    How long can you walk comfortably?  pain immediately    Patient Stated Goals  get back to the gym, running    Currently in Pain?  Yes    Pain Score  2     Pain Location  Ankle    Pain Orientation  Right    Pain Descriptors / Indicators  Aching    Pain Onset  More than a month ago    Pain Frequency  Intermittent    Aggravating Factors   standing, walking    Pain Relieving Factors  ice occassionally          [facility] PT Assessment - 04/09/17 1022      Assessment   Medical Diagnosis  Right ankle arthroscopy    Referring Provider  Alfredo Martinez - [facility]    Onset Date/Surgical Date  02/21/17  Next MD Visit  04/26/2017    Prior Therapy  no      Precautions   Precautions  None      Restrictions   Weight Bearing Restrictions  No      Balance Screen   Has the patient fallen in the past 6 months  No    Has the patient had a decrease in activity level because of a fear of falling?   No    Is the patient reluctant to leave their home because of a fear of falling?   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      Cognition   Overall Cognitive Status   Within Functional Limits for tasks assessed      Sensation   Light Touch  Appears Intact      Coordination   Gross Motor Movements are Fluid and Coordinated  Yes      Functional Tests   Functional tests  Single leg stance;Other;Other2      Single Leg Stance   Comments  L more stable than R; able to stand >10 sec bilaterally      Other:   Other/ Comments  Tandem stance - mild unsteadiness      Other:   Other/Comments  narrow BOS - no issue      Posture/Postural Control   Posture/Postural Control  Postural limitations    Postural Limitations  Rounded Shoulders;Forward head      ROM / Strength   AROM / PROM / Strength  AROM;PROM;Strength      AROM   AROM Assessment Site  Ankle    Right/Left Ankle  Right    Right Ankle Dorsiflexion  0    Right Ankle Plantar Flexion  33    Right Ankle Inversion  35    Right Ankle Eversion  12      PROM   PROM Assessment Site  Ankle    Right/Left Ankle  Right    Right Ankle Dorsiflexion  2    Right Ankle Inversion  40    Right Ankle Eversion  18      Strength   Strength Assessment Site  Hip;Knee;Ankle    Right/Left Hip  Right;Left    Right Hip Flexion  4+/5    Right Hip ABduction  4+/5    Left Hip Flexion  5/5    Left Hip ABduction  5/5    Right/Left Knee  Right;Left    Right Knee Flexion  4+/5    Right Knee Extension  5/5    Left Knee Flexion  5/5    Left Knee Extension  5/5    Right/Left Ankle  Right;Left    Right Ankle Dorsiflexion  4+/5    Left Ankle Dorsiflexion  5/5      Ambulation/Gait   Gait Pattern  Lateral trunk lean to right;Lateral trunk lean to left;Poor foot clearance - right    Gait velocity  3.39ft/sec    Gait Comments  ambulates with increases motion in the frontal plane, does  not fully roll R ankle forward into toe off; tends to walk on lateral border of R foot              Objective measurements completed on examination: See above findings.      [facility]/Exercise - 04/09/17 1022       Exercises   Exercises  Knee/Hip;Ankle      Ankle Exercises: Standing   Heel Raises  10 reps;2 seconds      Ankle Exercises: Seated   Other Seated Ankle Exercises  inversion/eversion; red tband; 10 reps each             PT Education - 04/09/17 1155    Education provided  Yes    Education Details  exam findings, HEP, POC    Person(s) Educated  Patient    Methods  Explanation;Demonstration;Handout    Comprehension  Verbalized understanding;Returned demonstration       PT Short Term Goals - 04/09/17 1204      PT SHORT TERM GOAL #1   Title  patient to be independent with initial HEP    Status  New    Target Date  04/23/17        PT Long Term Goals - 04/09/17 1204      PT LONG TERM GOAL #1   Title  patient to be independent with advanced HEP    Status  New    Target Date  05/21/17      PT LONG TERM GOAL #2   Title  patient to improve LE strength to 5/5 to improve functional mobility    Status  New    Target Date  05/21/17      PT LONG TERM GOAL #3   Title  patient to improve R ankle AROM to 8 deg. DF and 38 deg. PF    Status  New    Target Date  05/21/17      PT LONG TERM GOAL #4   Title  patient to report ability to walk with normal gait pattern for >/20 minutes without pain limiting    Status  New    Target Date  05/21/17      PT LONG TERM GOAL #5   Title  patient to demonstrate/report ability to navigate full flight of stairs with reciprocal stepping pattern and no hand rails without pain limiting    Status  New    Target Date  05/21/17             Plan - 04/09/17 1156    Clinical Impression Statement  Mr. Mccuiston is a 31 y.o male presenting to OPPT s/p R ankle arthroscopy. Patient reported he has been out of a boot for two weeks and MD has advised for no further restrictions regarding weight bearing or activity limitations. Patient has residual abnormal gait pattern from original ankle injury in 2013 that is causing pain in R heel/knee/hip.  Patient reports having pain with walking and all standing activities. Patient demonstrating minor weakness in R LE musculature and mild ROM deficits in the R ankle. Patient would benefit from skilled PT services to address strength and ROM deficits as well as gait abnormalities in order to improve patient functional mobility and QOL.     Clinical Presentation  Stable    Clinical Decision Making  Low    Rehab Potential  Good    PT Frequency  2x / week    PT Duration  6 weeks    PT  Treatment/Interventions  ADLs/Self Care Home Management;Cryotherapy;Electrical Stimulation;Ultrasound;Moist Heat;Iontophoresis 4mg /ml Dexamethasone;Gait training;Stair training;Functional mobility training;Therapeutic activities;Therapeutic exercise;Balance training;Patient/family education;Neuromuscular re-education;Manual techniques;Passive range of motion;Vasopneumatic Device;Taping;Dry needling    Consulted and Agree with Plan of Care  Patient       Patient will benefit from skilled therapeutic intervention in order to improve the following deficits and impairments:  Abnormal gait, Pain, Decreased activity tolerance, Decreased endurance, Decreased range of motion, Decreased strength, Difficulty walking  Visit Diagnosis: Pain in right ankle and joints of right foot  Stiffness of right ankle, not elsewhere classified  Other abnormalities of gait and mobility  Other symptoms and signs involving the musculoskeletal system     Problem List There are no active problems to display for this patient.   Emerson MonteKimberly Leonel Mccollum, SPT 04/09/17 12:56 PM    [facility] [place] [address]  [address] [place], [place], [address] Phone: [phone]   Fax:  [phone]  Name: Nicholas Montgomery MRN: [id number] Date of Birth: 31-May-1985

## 2017-04-09 NOTE — Patient Instructions (Signed)
Gastroc Stretch    Stand with right foot back, leg straight, forward leg bent. Keeping heel on floor, turned slightly out, lean into wall until stretch is felt in calf. Hold __30__ seconds. Repeat 3 times.     Ankle Awareness    Using support for balance, stand with feet shoulder width apart. Rise up on toes. Hold __3__ seconds. Relax. Repeat _15_ times.     Ankle Eversion: Long-Sitting    Loop tubing around feet just below toes, legs separated as far as tolerated, toes pointed inward. Rotate ankles, pointing toes outward. Repeat _10_ times per set. Do _2_ sets per session.     Ankle Inversion: Long-Sitting (Single Leg)    Tubing around forefoot, on same side as anchor, rotate ankle, pointing toes inward. Repeat _10_ times per set. Do _2_ sets per session.

## 2017-04-14 ENCOUNTER — Ambulatory Visit: Payer: Managed Care, Other (non HMO) | Attending: Orthopedic Surgery

## 2017-04-14 DIAGNOSIS — M25571 Pain in right ankle and joints of right foot: Secondary | ICD-10-CM | POA: Insufficient documentation

## 2017-04-14 DIAGNOSIS — R29898 Other symptoms and signs involving the musculoskeletal system: Secondary | ICD-10-CM

## 2017-04-14 DIAGNOSIS — M25671 Stiffness of right ankle, not elsewhere classified: Secondary | ICD-10-CM | POA: Insufficient documentation

## 2017-04-14 DIAGNOSIS — R2689 Other abnormalities of gait and mobility: Secondary | ICD-10-CM | POA: Insufficient documentation

## 2017-04-14 NOTE — Therapy (Signed)
[facility] [place] [address]  [address] [place], [place], [address] Phone: [phone]   Fax:  [phone]  Physical Therapy Treatment  Patient Details  Name: Nicholas Montgomery MRN: [id number] Date of Birth: [birth year] Referring Provider: Alfredo MartinezJustin Ollis - [facility]   Encounter Date: 04/14/2017  PT End of Session - 04/14/17 1541    Visit Number  2    Number of Visits  12    Date for PT Re-Evaluation  05/21/17    PT Start Time  1534    PT Stop Time  1606 pt. having to leave early due to scheduling conflict     PT Time Calculation (min)  32 min    Activity Tolerance  Patient tolerated treatment well    Behavior During Therapy  [facility] for tasks assessed/performed       Past Medical History:  Diagnosis Date  . Allergy-induced asthma    prn inhaler  . Ankle impingement syndrome, right 01/2017  . Dental crowns present   . 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 PONV (postoperative nausea and vomiting)    severe nausea, per pt.    Past Surgical History:  Procedure Laterality Date  . ANKLE ARTHROSCOPY Right 2015  . ANKLE ARTHROSCOPY WITH DRILLING/MICROFRACTURE Right 02/12/2017   Procedure: ANKLE ARTHROSCOPY WITH EXTENSIVE DEBRIDEMENT AND SUBCHONDROPLASTY;  Surgeon: Toni ArthursHewitt, John, MD;  Location: [facility];  Service: Orthopedics;  Laterality: Right;  requests 90mins  . ANTERIOR CRUCIATE LIGAMENT REPAIR Right 2003  . SHOULDER ARTHROSCOPY Left 2016  . SHOULDER ARTHROSCOPY Right 2016  . WISDOM TOOTH EXTRACTION      There were no vitals filed for this visit.  Subjective Assessment - 04/14/17 1537    Subjective  Pt. reporting he has been pain free last few days.  Only ankle pain has been while performing HEP stretch leaning into wall.      Patient Stated Goals  get back to the gym, running    Currently in Pain?  No/denies    Pain Score  0-No pain up to 7/10 with ankle stretching     Multiple Pain Sites  No                       [facility] Adult PT Treatment/Exercise - 04/14/17 1548      Knee/Hip Exercises: Aerobic   Nustep  --      Knee/Hip Exercises: Machines for Strengthening   Cybex Knee Extension  B LE con/R ecc 25# x 10 reps     Cybex Knee Flexion  B LE con/R ecc 35# x 10 reps       Knee/Hip Exercises: Standing   Step Down  Right;10 reps;Step Height: 4";Hand Hold: 1;2 sets    Step Down Limitations  chair     SLS  R SLS with opposite LE toe-touch to cones standing on blue foam oval 2 x 20 sec       Knee/Hip Exercises: Supine   Bridges with Clamshell  Both;10 reps;Strengthening with R hip abduction/ER into green TB at knees at top      Ankle Exercises: Aerobic   Stationary Bike  NuStep: lvl 6, 6 min       Ankle Exercises: Stretches   Soleus Stretch  2 reps;30 seconds    Soleus Stretch Limitations  R    Gastroc Stretch  2 reps;30 seconds    Gastroc Stretch Limitations  R  Ankle Exercises: Seated   Other Seated Ankle Exercises  R ankle IV with red TB x 20 reps     Other Seated Ankle Exercises  B ankle eversion with red looped TB at forefoot x 20 reps               PT Short Term Goals - 04/14/17 1546      PT SHORT TERM GOAL #1   Title  patient to be independent with initial HEP    Status  On-going        PT Long Term Goals - 04/14/17 1547      PT LONG TERM GOAL #1   Title  patient to be independent with advanced HEP    Status  On-going      PT LONG TERM GOAL #2   Title  patient to improve LE strength to 5/5 to improve functional mobility    Status  On-going      PT LONG TERM GOAL #3   Title  patient to improve R ankle AROM to 8 deg. DF and 38 deg. PF    Status  On-going      PT LONG TERM GOAL #4   Title  patient to report ability to walk with normal gait pattern for >/20 minutes without pain limiting    Status  On-going      PT LONG TERM GOAL #5   Title  patient to demonstrate/report ability to navigate full flight of stairs with  reciprocal stepping pattern and no hand rails without pain limiting    Status  On-going            Plan - 04/14/17 1547    Clinical Impression Statement  Nicholas Montgomery doing well today reporting only recent R ankle pain was with R calf stretch with HEP.  This stretch reviewed with pt. today with cues for pt. to decrease intensity of stretch and good tolerance following this.  Tolerated all R LE strengthening activities well and demonstrating moderate challenge with SLS on foam.  Pt. reporting he needs to leave early from treatment in order to get daughter to chess club thus session time limited.  Will monitor response to initiation of strengthening and balance activities in coming visit.      PT Treatment/Interventions  ADLs/Self Care Home Management;Cryotherapy;Electrical Stimulation;Ultrasound;Moist Heat;Iontophoresis 4mg /ml Dexamethasone;Gait training;Stair training;Functional mobility training;Therapeutic activities;Therapeutic exercise;Balance training;Patient/family education;Neuromuscular re-education;Manual techniques;Passive range of motion;Vasopneumatic Device;Taping;Dry needling    Consulted and Agree with Plan of Care  Patient       Patient will benefit from skilled therapeutic intervention in order to improve the following deficits and impairments:  Abnormal gait, Pain, Decreased activity tolerance, Decreased endurance, Decreased range of motion, Decreased strength, Difficulty walking  Visit Diagnosis: Pain in right ankle and joints of right foot  Stiffness of right ankle, not elsewhere classified  Other abnormalities of gait and mobility  Other symptoms and signs involving the musculoskeletal system     Problem List There are no active problems to display for this patient.   Kermit BaloMicah Glennie Bose, PTA 04/14/17 4:25 PM  [facility] [place] [address]  [address] [place], [place], [address] Phone: [phone]   Fax:   [phone]  Name: Nicholas Montgomery MRN: [id number] Date of Birth: [birth year]

## 2017-04-16 ENCOUNTER — Ambulatory Visit: Payer: Managed Care, Other (non HMO) | Admitting: Physical Therapy

## 2017-04-16 ENCOUNTER — Encounter: Payer: Self-pay | Admitting: Physical Therapy

## 2017-04-16 DIAGNOSIS — M25571 Pain in right ankle and joints of right foot: Secondary | ICD-10-CM | POA: Diagnosis not present

## 2017-04-16 DIAGNOSIS — R2689 Other abnormalities of gait and mobility: Secondary | ICD-10-CM

## 2017-04-16 DIAGNOSIS — M25671 Stiffness of right ankle, not elsewhere classified: Secondary | ICD-10-CM

## 2017-04-16 DIAGNOSIS — R29898 Other symptoms and signs involving the musculoskeletal system: Secondary | ICD-10-CM

## 2017-04-16 NOTE — Therapy (Signed)
[facility] [address]  [address] [place], [place], [address] Phone: [phone]   Fax:  [phone]  Physical Therapy Treatment  Patient Details  Name: Nicholas KernDavion Hoge MRN: [id number] Date of Birth: 07-22-1985 Referring Provider: Alfredo MartinezJustin Ollis - [facility]   Encounter Date: 04/16/2017  PT End of Session - 04/16/17 1024    Visit Number  3    Number of Visits  12    Date for PT Re-Evaluation  05/21/17    PT Start Time  0931    PT Stop Time  1016    PT Time Calculation (min)  45 min    Activity Tolerance  Patient tolerated treatment well    Behavior During Therapy  [facility] for tasks assessed/performed       Past Medical History:  Diagnosis Date  . Allergy-induced asthma    prn inhaler  . Ankle impingement syndrome, right 01/2017  . Dental crowns present   . 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 PONV (postoperative nausea and vomiting)    severe nausea, per pt.    Past Surgical History:  Procedure Laterality Date  . ANKLE ARTHROSCOPY Right 2015  . ANKLE ARTHROSCOPY WITH DRILLING/MICROFRACTURE Right 02/12/2017   Procedure: ANKLE ARTHROSCOPY WITH EXTENSIVE DEBRIDEMENT AND SUBCHONDROPLASTY;  Surgeon: Toni ArthursHewitt, John, MD;  Location: [facility];  Service: Orthopedics;  Laterality: Right;  requests 90mins  . ANTERIOR CRUCIATE LIGAMENT REPAIR Right 2003  . SHOULDER ARTHROSCOPY Left 2016  . SHOULDER ARTHROSCOPY Right 2016  . WISDOM TOOTH EXTRACTION      There were no vitals filed for this visit.  Subjective Assessment - 04/16/17 0935    Subjective  Patient reporting no change since last visit. Feels he has been making concious effort to walk differently and some pain in arch of foot.    Currently in Pain?  No/denies    Pain Score  0-No pain                      [facility] Adult PT Treatment/Exercise - 04/16/17 0933      Knee/Hip Exercises: Stretches   Gastroc Stretch  Right;3 reps;30  seconds blue rocker      Knee/Hip Exercises: Standing   Heel Raises  15 reps B con/R ecc; UE support at [employer]    Forward Step Up  Right;2 sets;10 reps;Step Height: 6"    Wall Squat  15 reps;Limitations green band around knees    Wall Squat Limitations  cueing to prevent weight shift to L    Other Standing Knee Exercises  hip drop; 2 x 10; 6" step; UE support cueing to keep R knee straight      Ankle Exercises: Aerobic   Stationary Bike  NuStep: lvl 6, 6 min       Ankle Exercises: Seated   BAPS  Sitting;10 reps 5# on board; forward, inversion, eversion      Ankle Exercises: Plyometrics   Plyometric Exercises  2 x 20 forward/lateral line jumps    Plyometric Exercises  ladder drills; high knees 2 feet each box; 2 laps each               PT Short Term Goals - 04/14/17 1546      PT SHORT TERM GOAL #1   Title  patient to be independent with initial HEP    Status  On-going        PT Long Term Goals - 04/14/17  1547      PT LONG TERM GOAL #1   Title  patient to be independent with advanced HEP    Status  On-going      PT LONG TERM GOAL #2   Title  patient to improve LE strength to 5/5 to improve functional mobility    Status  On-going      PT LONG TERM GOAL #3   Title  patient to improve R ankle AROM to 8 deg. DF and 38 deg. PF    Status  On-going      PT LONG TERM GOAL #4   Title  patient to report ability to walk with normal gait pattern for >/20 minutes without pain limiting    Status  On-going      PT LONG TERM GOAL #5   Title  patient to demonstrate/report ability to navigate full flight of stairs with reciprocal stepping pattern and no hand rails without pain limiting    Status  On-going            Plan - 04/16/17 1025    Clinical Impression Statement  Patient progressing well with all activities and able to tolerate progression of exercises to include mild impact on R ankle. Attemped bilateral hopping during session today for first time since surgery  with patient tolerating well. Continuing to work on equal weight bearing between LE during squatting and ambulation as patient continuing to demonstrate heel strike on lateral border of R foot and vaulting on R LE. Will progress upcoming sessions to patient tolerance.    PT Treatment/Interventions  ADLs/Self Care Home Management;Cryotherapy;Electrical Stimulation;Ultrasound;Moist Heat;Iontophoresis 4mg /ml Dexamethasone;Gait training;Stair training;Functional mobility training;Therapeutic activities;Therapeutic exercise;Balance training;Patient/family education;Neuromuscular re-education;Manual techniques;Passive range of motion;Vasopneumatic Device;Taping;Dry needling    Consulted and Agree with Plan of Care  Patient       Patient will benefit from skilled therapeutic intervention in order to improve the following deficits and impairments:  Abnormal gait, Pain, Decreased activity tolerance, Decreased endurance, Decreased range of motion, Decreased strength, Difficulty walking  Visit Diagnosis: Pain in right ankle and joints of right foot  Stiffness of right ankle, not elsewhere classified  Other abnormalities of gait and mobility  Other symptoms and signs involving the musculoskeletal system     Problem List There are no active problems to display for this patient.    Emerson MonteKimberly Kailana Benninger, SPT 04/16/17 10:31 AM    [facility] [place] [address]  [address] [place], [place], [address] Phone: [phone]   Fax:  [phone]  Name: Nicholas KernDavion Mclean MRN: [id number] Date of Birth: [birth year]

## 2017-04-21 ENCOUNTER — Ambulatory Visit: Payer: Managed Care, Other (non HMO) | Admitting: Physical Therapy

## 2017-04-23 ENCOUNTER — Ambulatory Visit: Payer: Managed Care, Other (non HMO)

## 2017-04-23 DIAGNOSIS — R29898 Other symptoms and signs involving the musculoskeletal system: Secondary | ICD-10-CM

## 2017-04-23 DIAGNOSIS — M25671 Stiffness of right ankle, not elsewhere classified: Secondary | ICD-10-CM

## 2017-04-23 DIAGNOSIS — R2689 Other abnormalities of gait and mobility: Secondary | ICD-10-CM

## 2017-04-23 DIAGNOSIS — M25571 Pain in right ankle and joints of right foot: Secondary | ICD-10-CM | POA: Diagnosis not present

## 2017-04-23 NOTE — Therapy (Signed)
[facility] [address]  [address] [place], [place], [address] Phone: [phone]   Fax:  [phone]  Physical Therapy Treatment  Patient Details  Name: Nicholas Montgomery MRN: [id number] Date of Birth: 28-Nov-1985 Referring Provider: Alfredo MartinezJustin Ollis - [facility]   Encounter Date: 04/23/2017  PT End of Session - 04/23/17 0834    Visit Number  4    Number of Visits  12    Date for PT Re-Evaluation  05/21/17    PT Start Time  0814 pt. arrived late     PT Stop Time  0842    PT Time Calculation (min)  28 min    Activity Tolerance  Patient tolerated treatment well    Behavior During Therapy  [facility] for tasks assessed/performed       Past Medical History:  Diagnosis Date  . Allergy-induced asthma    prn inhaler  . Ankle impingement syndrome, right 01/2017  . Dental crowns present   . 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 PONV (postoperative nausea and vomiting)    severe nausea, per pt.    Past Surgical History:  Procedure Laterality Date  . ANKLE ARTHROSCOPY Right 2015  . ANKLE ARTHROSCOPY WITH DRILLING/MICROFRACTURE Right 02/12/2017   Procedure: ANKLE ARTHROSCOPY WITH EXTENSIVE DEBRIDEMENT AND SUBCHONDROPLASTY;  Surgeon: Toni ArthursHewitt, John, MD;  Location: [facility];  Service: Orthopedics;  Laterality: Right;  requests 90mins  . ANTERIOR CRUCIATE LIGAMENT REPAIR Right 2003  . SHOULDER ARTHROSCOPY Left 2016  . SHOULDER ARTHROSCOPY Right 2016  . WISDOM TOOTH EXTRACTION      There were no vitals filed for this visit.  Subjective Assessment - 04/23/17 0818    Subjective  Pt. doing well today.  Reports having less pain in arch of foot with walking.      Patient Stated Goals  get back to the gym, running    Currently in Pain?  No/denies    Pain Score  0-No pain    Multiple Pain Sites  No                      [facility] Adult PT Treatment/Exercise - 04/23/17 0819      Neuro Re-ed    Neuro  Re-ed Details   on blue foam balance beam: 12 cone nock over/put back; no UE support       Knee/Hip Exercises: Stretches   Gastroc Stretch  1 rep;30 seconds;Right      Knee/Hip Exercises: Aerobic   Recumbent Bike  Lvl 2, 4 min       Knee/Hip Exercises: Machines for Strengthening   Cybex Knee Extension  B LE con/R ecc 25# x 15 reps     Cybex Knee Flexion  B LE con/R ecc 35# x 15 reps       Knee/Hip Exercises: Standing   Step Down  Step Height: 8";10 reps;Hand Hold: 1;Right    Step Down Limitations  focusing on eccentric control     SLS  R shallow RDL with UE touch to mat table x 5 reps; difficulty     SLS with Vectors  R SLS with opposite LE toe-touch to rubber bubbles (front, side, back) 3 x 5 reps; light UE suppor t    Other Standing Knee Exercises  BOSU ball side side rocking (down) x 20 reps each way  no UE support     Other Standing Knee Exercises  Alternating lunge onto BOSU ball (up)  x 15 reps; 1 ski pole                PT Short Term Goals - 04/23/17 1235      PT SHORT TERM GOAL #1   Title  patient to be independent with initial HEP    Status  Achieved        PT Long Term Goals - 04/14/17 1547      PT LONG TERM GOAL #1   Title  patient to be independent with advanced HEP    Status  On-going      PT LONG TERM GOAL #2   Title  patient to improve LE strength to 5/5 to improve functional mobility    Status  On-going      PT LONG TERM GOAL #3   Title  patient to improve R ankle AROM to 8 deg. DF and 38 deg. PF    Status  On-going      PT LONG TERM GOAL #4   Title  patient to report ability to walk with normal gait pattern for >/20 minutes without pain limiting    Status  On-going      PT LONG TERM GOAL #5   Title  patient to demonstrate/report ability to navigate full flight of stairs with reciprocal stepping pattern and no hand rails without pain limiting    Status  On-going            Plan - 04/23/17 0845    Clinical Impression Statement  Pt.  reporting he purchased Prostretch DF rocker and has been using this at home without issue.  Reports consistent HEP performance.  Tolerated additional balance/stability activities on compliant surface today well.  Most difficulty today with R RDL with limited control.  Seems to be tolerating activities in treatment well and progressing toward goals.    PT Treatment/Interventions  ADLs/Self Care Home Management;Cryotherapy;Electrical Stimulation;Ultrasound;Moist Heat;Iontophoresis 4mg /ml Dexamethasone;Gait training;Stair training;Functional mobility training;Therapeutic activities;Therapeutic exercise;Balance training;Patient/family education;Neuromuscular re-education;Manual techniques;Passive range of motion;Vasopneumatic Device;Taping;Dry needling    Consulted and Agree with Plan of Care  Patient       Patient will benefit from skilled therapeutic intervention in order to improve the following deficits and impairments:  Abnormal gait, Pain, Decreased activity tolerance, Decreased endurance, Decreased range of motion, Decreased strength, Difficulty walking  Visit Diagnosis: Pain in right ankle and joints of right foot  Stiffness of right ankle, not elsewhere classified  Other abnormalities of gait and mobility  Other symptoms and signs involving the musculoskeletal system     Problem List There are no active problems to display for this patient.   Kermit BaloMicah Clydie Dillen, PTA 04/23/17 12:42 PM  [facility] [address]  [address] [place], [place], [address] Phone: [phone]   Fax:  [phone]  Name: Nicholas Montgomery MRN: [id number] Date of Birth: 1986/02/27

## 2017-04-28 ENCOUNTER — Ambulatory Visit: Payer: Managed Care, Other (non HMO)

## 2017-04-28 DIAGNOSIS — M25571 Pain in right ankle and joints of right foot: Secondary | ICD-10-CM

## 2017-04-28 DIAGNOSIS — M25671 Stiffness of right ankle, not elsewhere classified: Secondary | ICD-10-CM

## 2017-04-28 DIAGNOSIS — R2689 Other abnormalities of gait and mobility: Secondary | ICD-10-CM

## 2017-04-28 DIAGNOSIS — R29898 Other symptoms and signs involving the musculoskeletal system: Secondary | ICD-10-CM

## 2017-04-28 NOTE — Therapy (Signed)
[facility] [address]  [address] [place], [place], [address] Phone: [phone]   Fax:  [phone]  Physical Therapy Treatment  Patient Details  Name: Nicholas Montgomery MRN: [id number] Date of Birth: [birth year] Referring Provider: Alfredo MartinezJustin Ollis - [facility]   Encounter Date: 04/28/2017  PT End of Session - 04/28/17 1406    Visit Number  5    Number of Visits  12    Date for PT Re-Evaluation  05/21/17    PT Start Time  1404    PT Stop Time  1448    PT Time Calculation (min)  44 min    Activity Tolerance  Patient tolerated treatment well    Behavior During Therapy  [facility]FL for tasks assessed/performed       Past Medical History:  Diagnosis Date  . Allergy-induced asthma    prn inhaler  . Ankle impingement syndrome, right 01/2017  . Dental crowns present   . 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 PONV (postoperative nausea and vomiting)    severe nausea, per pt.    Past Surgical History:  Procedure Laterality Date  . ANKLE ARTHROSCOPY Right 2015  . ANKLE ARTHROSCOPY WITH DRILLING/MICROFRACTURE Right 02/12/2017   Procedure: ANKLE ARTHROSCOPY WITH EXTENSIVE DEBRIDEMENT AND SUBCHONDROPLASTY;  Surgeon: Toni ArthursHewitt, John, MD;  Location: [facility];  Service: Orthopedics;  Laterality: Right;  requests 90mins  . ANTERIOR CRUCIATE LIGAMENT REPAIR Right 2003  . SHOULDER ARTHROSCOPY Left 2016  . SHOULDER ARTHROSCOPY Right 2016  . WISDOM TOOTH EXTRACTION      There were no vitals filed for this visit.  Subjective Assessment - 04/28/17 1406    Subjective  Pt. reporting he felt good following last visit.      Limitations  Standing;Walking    Patient Stated Goals  get back to the gym, running    Currently in Pain?  No/denies    Pain Score  0-No pain    Multiple Pain Sites  No                      [facility] Adult PT Treatment/Exercise - 04/28/17 1408      Knee/Hip Exercises: Stretches   Gastroc Stretch  1 rep;30 seconds;Right prostretch       Knee/Hip Exercises: Aerobic   Recumbent Bike  Lvl 4, 6 min       Knee/Hip Exercises: Machines for Strengthening   Cybex Knee Extension  B LE con/R ecc 35# x 15 reps     Cybex Knee Flexion  B LE con/R ecc 45# x 15 reps     Cybex Leg Press  R LE only: 35# x 20 reps       Knee/Hip Exercises: Standing   Heel Raises  15 reps    Heel Raises Limitations  B con/R ecc  UBE    Step Down  Step Height: 8";10 reps;Hand Hold: 1;Right    Step Down Limitations  heel touch; 1 ski pole    Wall Squat  10 reps;3 seconds    Wall Squat Limitations  Alternating LAQ kick outs difficulty with this     SLS  --    Other Standing Knee Exercises  BOSU ball (down) side side rocking x 20 reps each way; (down) double leg stance with ball toss x 1 min; R step up onto BOSU ball (up) x 10 reps; 1 ski pole support       Other Standing Knee  Exercises  Split squat with R LE forward and L LE elevated on mat table x 10 reps      Ankle Exercises: Standing   Heel Walk (Round Trip)  60 ft     Toe Walk (Round Trip)  60 ft     Other Standing Ankle Exercises  Monster walk, side stepping with green TB at forefoot 2 x 30 ft              PT Education - 04/28/17 1452    Education provided  Yes    Education Details  Monster walk with green TB at forefoot, side stepping with green TB at forefoot     Person(s) Educated  Patient    Methods  Explanation;Demonstration;Verbal cues;Handout    Comprehension  Verbalized understanding;Returned demonstration;Verbal cues required;Need further instruction       PT Short Term Goals - 04/23/17 1235      PT SHORT TERM GOAL #1   Title  patient to be independent with initial HEP    Status  Achieved        PT Long Term Goals - 04/14/17 1547      PT LONG TERM GOAL #1   Title  patient to be independent with advanced HEP    Status  On-going      PT LONG TERM GOAL #2   Title  patient to improve LE strength to 5/5 to improve  functional mobility    Status  On-going      PT LONG TERM GOAL #3   Title  patient to improve R ankle AROM to 8 deg. DF and 38 deg. PF    Status  On-going      PT LONG TERM GOAL #4   Title  patient to report ability to walk with normal gait pattern for >/20 minutes without pain limiting    Status  On-going      PT LONG TERM GOAL #5   Title  patient to demonstrate/report ability to navigate full flight of stairs with reciprocal stepping pattern and no hand rails without pain limiting    Status  On-going            Plan - 04/28/17 1407    Clinical Impression Statement  Nicholas Montgomery tolerated progression of single-leg-stance stability activities well today without pain however did report fatigue following treatment.  Most difficulty with single leg BOSU ball balance and alternating kick out with wall sit.  HEP updated today with additional hip/ankle strengthening activities.  Progressing well toward goals and reports daily performance of Prostretch rocker and full HEP.       PT Treatment/Interventions  ADLs/Self Care Home Management;Cryotherapy;Electrical Stimulation;Ultrasound;Moist Heat;Iontophoresis 4mg /ml Dexamethasone;Gait training;Stair training;Functional mobility training;Therapeutic activities;Therapeutic exercise;Balance training;Patient/family education;Neuromuscular re-education;Manual techniques;Passive range of motion;Vasopneumatic Device;Taping;Dry needling    Consulted and Agree with Plan of Care  Patient       Patient will benefit from skilled therapeutic intervention in order to improve the following deficits and impairments:  Abnormal gait, Pain, Decreased activity tolerance, Decreased endurance, Decreased range of motion, Decreased strength, Difficulty walking  Visit Diagnosis: Pain in right ankle and joints of right foot  Stiffness of right ankle, not elsewhere classified  Other abnormalities of gait and mobility  Other symptoms and signs involving the  musculoskeletal system     Problem List There are no active problems to display for this patient.   Kermit Balo, PTA 04/28/17 3:18 PM  [facility] [address]  [address] [place], [place], [address] Phone: [phone]   Fax:  [phone]  Name: Nicholas Montgomery MRN: [id number] Date of Birth: [birth year]

## 2017-04-29 ENCOUNTER — Other Ambulatory Visit (INDEPENDENT_AMBULATORY_CARE_PROVIDER_SITE_OTHER): Payer: Self-pay | Admitting: Otolaryngology

## 2017-04-29 DIAGNOSIS — J329 Chronic sinusitis, unspecified: Secondary | ICD-10-CM

## 2017-04-30 ENCOUNTER — Ambulatory Visit: Payer: Managed Care, Other (non HMO)

## 2017-04-30 DIAGNOSIS — M25571 Pain in right ankle and joints of right foot: Secondary | ICD-10-CM

## 2017-04-30 DIAGNOSIS — R2689 Other abnormalities of gait and mobility: Secondary | ICD-10-CM

## 2017-04-30 DIAGNOSIS — R29898 Other symptoms and signs involving the musculoskeletal system: Secondary | ICD-10-CM

## 2017-04-30 DIAGNOSIS — M25671 Stiffness of right ankle, not elsewhere classified: Secondary | ICD-10-CM

## 2017-04-30 NOTE — Therapy (Addendum)
[facility] [address]  [place], [place], [address] Phone: [phone]   Fax:  [phone]  Physical Therapy Treatment  Patient Details  Name: Nicholas Montgomery MRN: [id number] Date of Birth: [birth year] Referring Provider: Mechele Claude - [facility]   Encounter Date: 04/30/2017  PT End of Session - 04/30/17 0936    Visit Number  6    Number of Visits  12    Date for PT Re-Evaluation  05/21/17    PT Start Time  0932    PT Stop Time  1021 10 min ice pack to end treatment     PT Time Calculation (min)  49 min    Activity Tolerance  Patient tolerated treatment well    Behavior During Therapy  [facility] for tasks assessed/performed       Past Medical History:  Diagnosis Date  . Allergy-induced asthma    prn inhaler  . Ankle impingement syndrome, right 01/2017  . Dental crowns present   . 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 PONV (postoperative nausea and vomiting)    severe nausea, per pt.    Past Surgical History:  Procedure Laterality Date  . ANKLE ARTHROSCOPY Right 2015  . ANKLE ARTHROSCOPY WITH DRILLING/MICROFRACTURE Right 02/12/2017   Procedure: ANKLE ARTHROSCOPY WITH EXTENSIVE DEBRIDEMENT AND SUBCHONDROPLASTY;  Surgeon: Wylene Simmer, MD;  Location: [facility];  Service: Orthopedics;  Laterality: Right;  requests 41mns  . ANTERIOR CRUCIATE LIGAMENT REPAIR Right 2003  . SHOULDER ARTHROSCOPY Left 2016  . SHOULDER ARTHROSCOPY Right 2016  . WISDOM TOOTH EXTRACTION      There were no vitals filed for this visit.  Subjective Assessment - 04/30/17 0935    Subjective  Pt. reporting, some R ankle swelling yesterday and has been having some "sharp" R buttocks pains since last visit when walking.      Patient Stated Goals  get back to the gym, running    Currently in Pain?  No/denies    Pain Score  0-No pain    Multiple Pain Sites  No                      [facility] Adult PT  Treatment/Exercise - 04/30/17 0941      Knee/Hip Exercises: Stretches   Piriformis Stretch  Right;2 reps;30 seconds    Piriformis Stretch Limitations  Figure-4, mod piriformis       Knee/Hip Exercises: Aerobic   Recumbent Bike  Lvl 4, 6 min       Knee/Hip Exercises: Standing   Hip Flexion  Left;10 reps;Knee straight;Stengthening    Hip Flexion Limitations  red TB at ankle; no UE support     Hip ADduction  Left;10 reps;Strengthening    Hip ADduction Limitations  red TB at ankle; no UE support     Hip Abduction  Left;10 reps;Knee straight;Stengthening    Abduction Limitations  red TB at ankle; no UE support     Hip Extension  Left;10 reps;Knee straight;Stengthening    Extension Limitations  red TB at ankle; no UE support       Modalities   Modalities  Cryotherapy      Cryotherapy   Number Minutes Cryotherapy  10 Minutes    Cryotherapy Location  Knee R    Type of Cryotherapy  Ice pack      Manual Therapy   Manual Therapy  Passive ROM;Soft tissue mobilization    Manual therapy comments  supine     Soft tissue mobilization  retrograde massage to R ankle to decrease swelling  no tenderness however noted swelling throughout ankle     Passive ROM  Manual R glute, piriformis x 30 sec       Ankle Exercises: Seated   Towel Crunch  5 reps    Towel Crunch Weights (lbs)  2.5#    BAPS  Sitting;10 reps;Level 3 7.5#; Side<>side, forward/backwards, CW, CCW x 10 reps     Other Seated Ankle Exercises  --    Other Seated Ankle Exercises  Seated R towel curls for instrinsic foot 2.5# on towel x 5 reps              PT Education - 04/30/17 1152    Education provided  Yes    Education Details  supine figure-4 stretch, seated piriformis stretch     Person(s) Educated  Patient    Methods  Explanation;Demonstration;Verbal cues;Handout    Comprehension  Verbalized understanding;Returned demonstration;Verbal cues required;Need further instruction       PT Short Term Goals - 04/23/17 1235       PT SHORT TERM GOAL #1   Title  patient to be independent with initial HEP    Status  Achieved        PT Long Term Goals - 04/14/17 1547      PT LONG TERM GOAL #1   Title  patient to be independent with advanced HEP    Status  On-going      PT LONG TERM GOAL #2   Title  patient to improve LE strength to 5/5 to improve functional mobility    Status  On-going      PT LONG TERM GOAL #3   Title  patient to improve R ankle AROM to 8 deg. DF and 38 deg. PF    Status  On-going      PT LONG TERM GOAL #4   Title  patient to report ability to walk with normal gait pattern for >/20 minutes without pain limiting    Status  On-going      PT LONG TERM GOAL #5   Title  patient to demonstrate/report ability to navigate full flight of stairs with reciprocal stepping pattern and no hand rails without pain limiting    Status  On-going            Plan - 04/30/17 1153    Clinical Impression Statement  Jkai seen to start treatment reporting onset of "Sharp" R buttocks pain yesterday when walking and feels he may have "tweaked" buttocks muscle last visit.  Also with complaint of increased R ankle swelling today.  Glute/piriformis stretching performed with pt. today and added to HEP as pt. noting good relief with this.  Therex focusing on gentle ankle stability activities and addition of 4-way SLR, which were tolerated well.  Unable to reproduce buttocks pain in treatment today and ended with ice pack to R ankle to decrease swelling.  Pt. leaving treatment pain free.  Will monitor response to updated HEP and progress per pt. tolerance in coming visits.      PT Treatment/Interventions  ADLs/Self Care Home Management;Cryotherapy;Electrical Stimulation;Ultrasound;Moist Heat;Iontophoresis 61m/ml Dexamethasone;Gait training;Stair training;Functional mobility training;Therapeutic activities;Therapeutic exercise;Balance training;Patient/family education;Neuromuscular re-education;Manual  techniques;Passive range of motion;Vasopneumatic Device;Taping;Dry needling    Consulted and Agree with Plan of Care  Patient       Patient will benefit from skilled therapeutic intervention in order to improve the following deficits and impairments:  Abnormal  gait, Pain, Decreased activity tolerance, Decreased endurance, Decreased range of motion, Decreased strength, Difficulty walking  Visit Diagnosis: Pain in right ankle and joints of right foot  Stiffness of right ankle, not elsewhere classified  Other abnormalities of gait and mobility  Other symptoms and signs involving the musculoskeletal system     Problem List There are no active problems to display for this patient.   Bess Harvest, PTA 04/30/17 1:13 PM   [facility] [address]  [place], [place], [address] Phone: [phone]   Fax:  [phone]  Name: Ifeanyichukwu Wickham MRN: [id number] Date of Birth: Aug 03, 1985   PHYSICAL THERAPY DISCHARGE SUMMARY  Visits from Start of Care: 6  Current functional level related to goals / functional outcomes:   Refer to above clinical impression for status as of last visit on 04/30/17. Pt was a NS for the next 2 scheduled visits and no further visits were scheduled, therefore will proceed with discharge from PT for this episode.   Remaining deficits:   Unable to assess due to failure to return.   Education / Equipment:   HEP  Plan: Patient agrees to discharge.  Patient goals were partially met. Patient is being discharged due to not returning since the last visit.  ?????    Percival Spanish, PT, MPT 06/22/17, 3:30 PM  [facility] [address]  [place], [place], [address] Phone: [phone]   Fax:  [phone]

## 2017-05-04 ENCOUNTER — Ambulatory Visit
Admission: RE | Admit: 2017-05-04 | Discharge: 2017-05-04 | Disposition: A | Discharge status: Home / Self Care | Payer: Managed Care, Other (non HMO) | Source: Ambulatory Visit | Attending: Otolaryngology | Admitting: Otolaryngology

## 2017-05-04 ENCOUNTER — Ambulatory Visit: Payer: Managed Care, Other (non HMO)

## 2017-05-04 DIAGNOSIS — J329 Chronic sinusitis, unspecified: Secondary | ICD-10-CM

## 2017-05-07 ENCOUNTER — Ambulatory Visit: Payer: Managed Care, Other (non HMO) | Admitting: Physical Therapy

## 2017-05-11 ENCOUNTER — Ambulatory Visit (INDEPENDENT_AMBULATORY_CARE_PROVIDER_SITE_OTHER): Payer: Managed Care, Other (non HMO) | Admitting: Otolaryngology

## 2017-05-11 DIAGNOSIS — J321 Chronic frontal sinusitis: Secondary | ICD-10-CM | POA: Diagnosis not present

## 2017-05-11 DIAGNOSIS — J323 Chronic sphenoidal sinusitis: Secondary | ICD-10-CM

## 2017-05-11 DIAGNOSIS — J320 Chronic maxillary sinusitis: Secondary | ICD-10-CM | POA: Diagnosis not present

## 2017-05-11 DIAGNOSIS — J322 Chronic ethmoidal sinusitis: Secondary | ICD-10-CM | POA: Diagnosis not present

## 2018-02-04 ENCOUNTER — Encounter: Payer: Managed Care, Other (non HMO) | Admitting: Family Medicine

## 2019-06-01 IMAGING — CT CT MAXILLOFACIAL W/O CM
3 series · 16 of 37 positions shown, 19 images · non-contrast
Comparison: Brain MRI 06/23/2003

CLINICAL DATA: Chronic sinusitis.

EXAM:
CT MAXILLOFACIAL WITHOUT CONTRAST
TECHNIQUE: Multidetector CT images of the paranasal sinuses were obtained using
the standard protocol without intravenous contrast.

[Series 3: axial soft 1.25 · axial · 0.49mm/px · z∈[-221,-144]mm · 4 of 187 slices shown]
[im 21/187  brain]
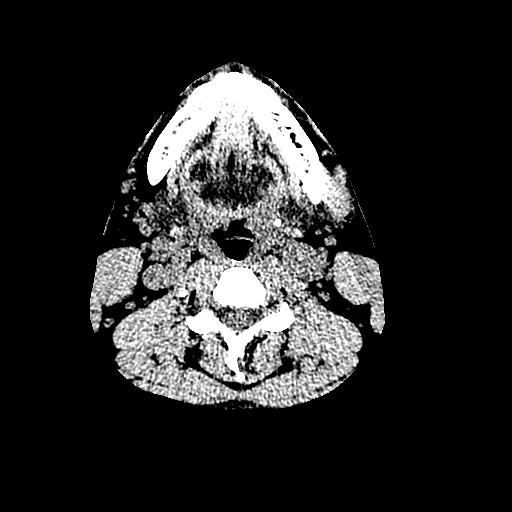
[im 42/187  brain]
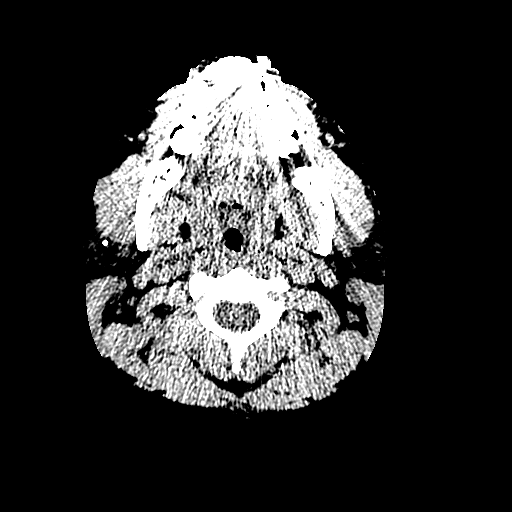
[im 63/187  brain]
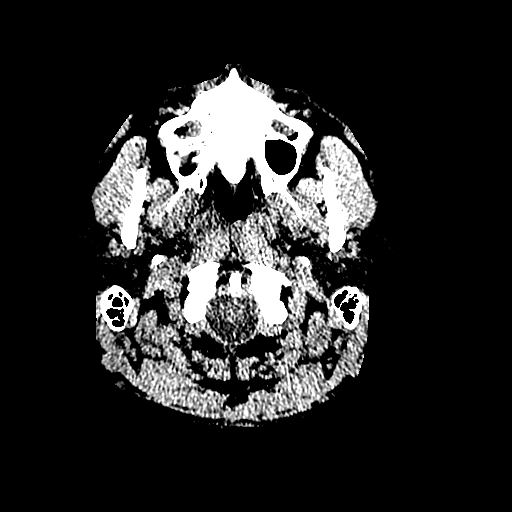
[im 83/187  brain]
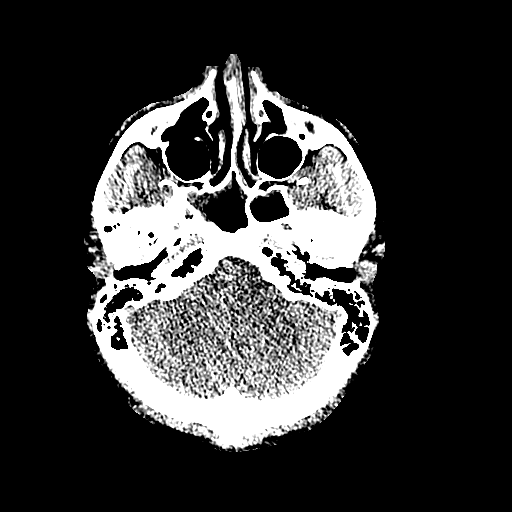

[Series 601: coronal facial · coronal · 0.49mm/px · 3 of 120 slices shown]
[im 40/120  bone]
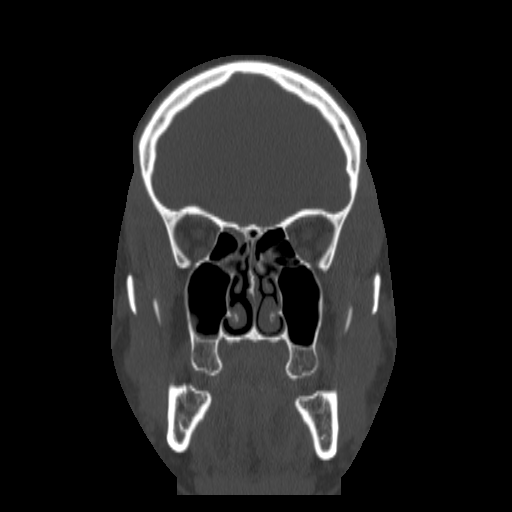
[im 60/120  bone]
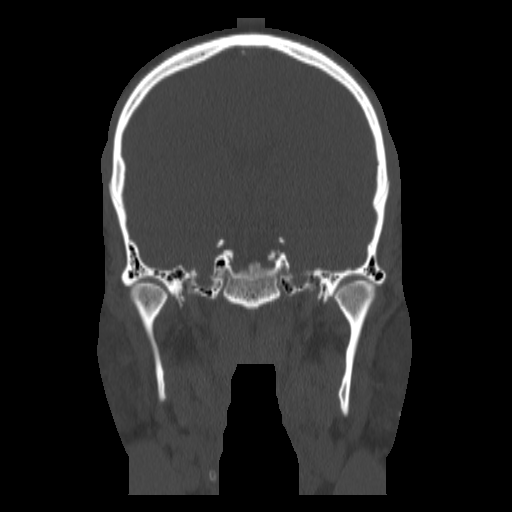
[im 80/120  bone]
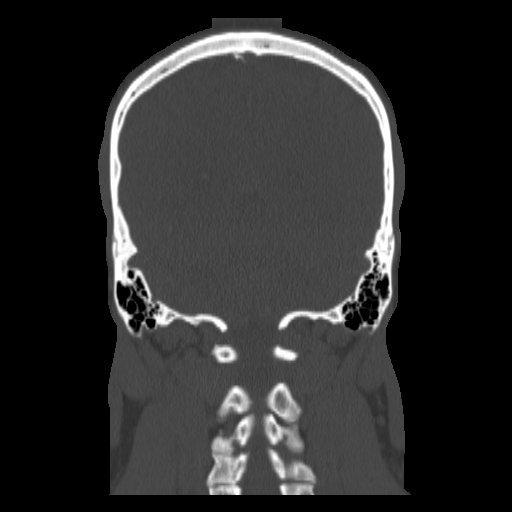

[Series 602: sagittal facial · sagittal · 0.49mm/px · 9 of 110 slices shown, 12 images]
[im 11/110  brain]
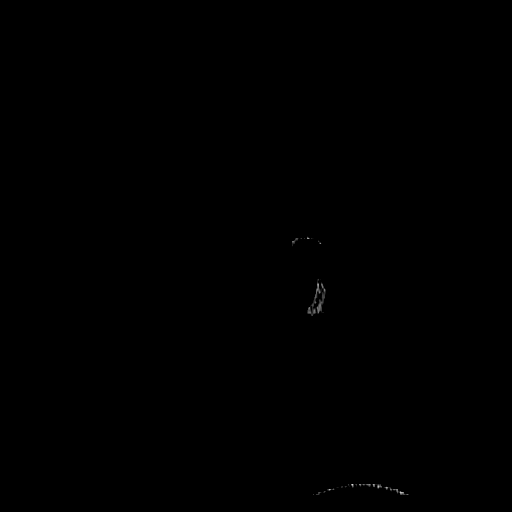
[im 11/110  bone]
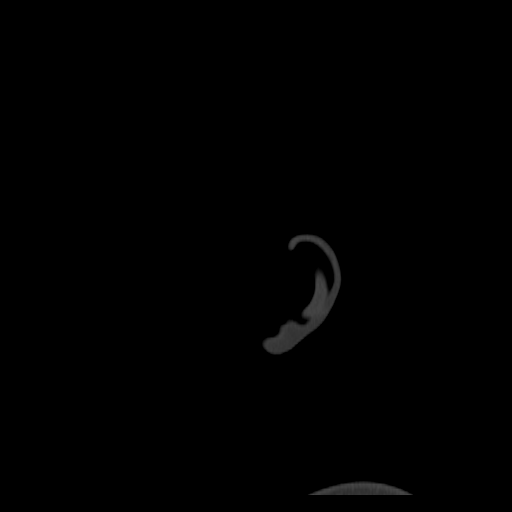
[im 22/110  bone]
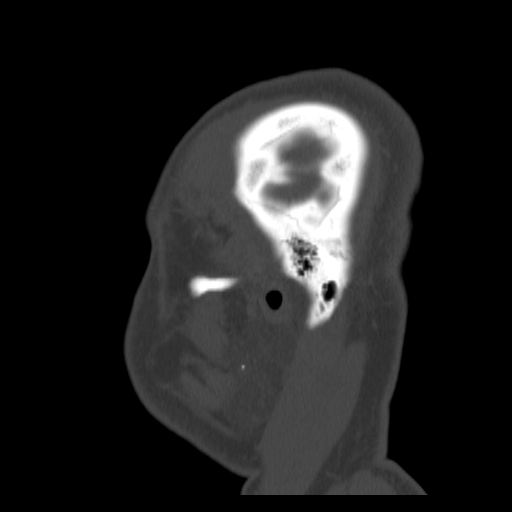
[im 33/110  bone]
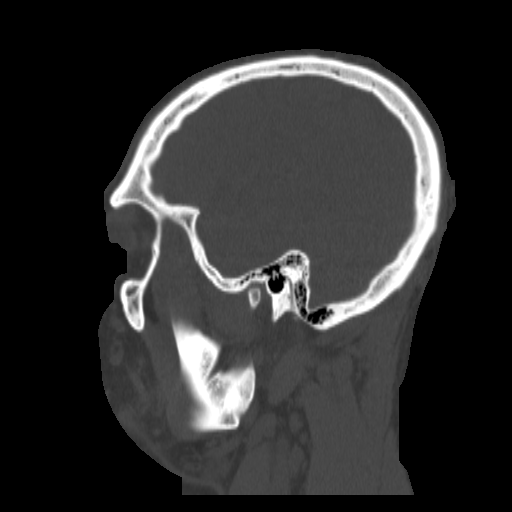
[im 44/110  bone]
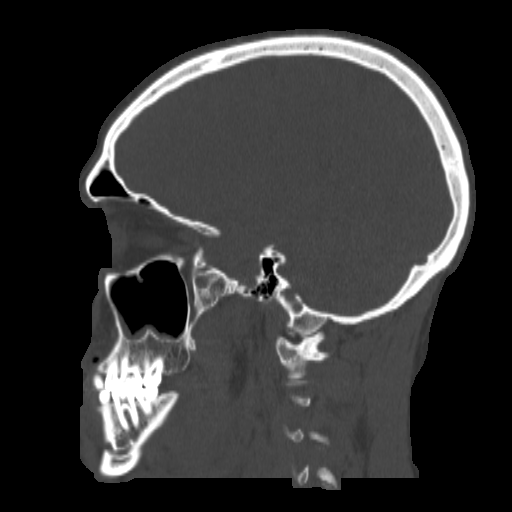
[im 55/110  brain]
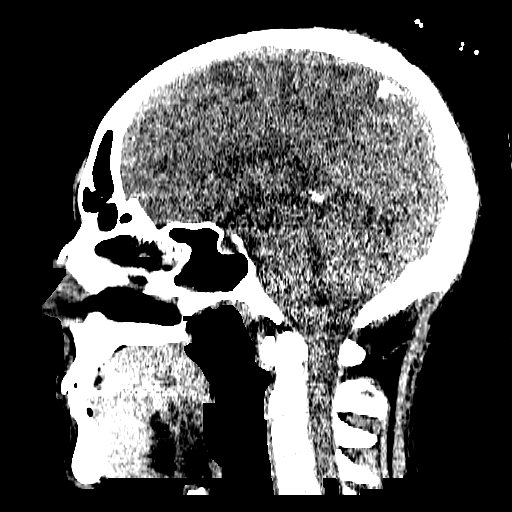
[im 55/110  bone]
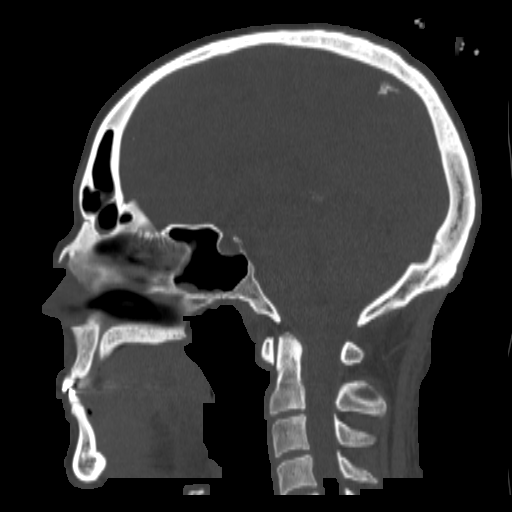
[im 66/110  bone]
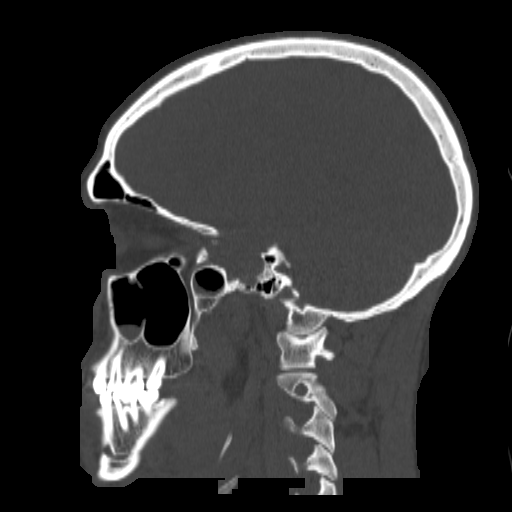
[im 77/110  bone]
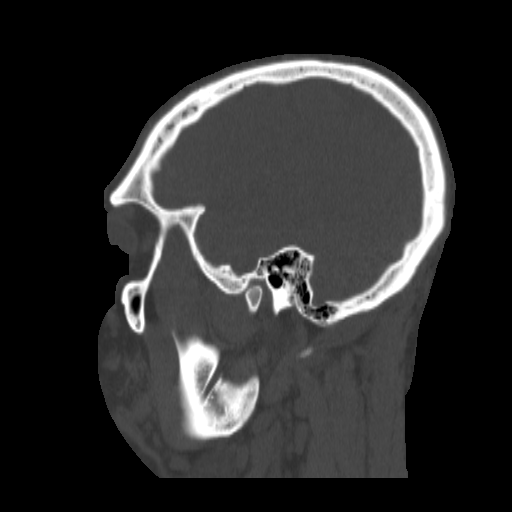
[im 88/110  bone]
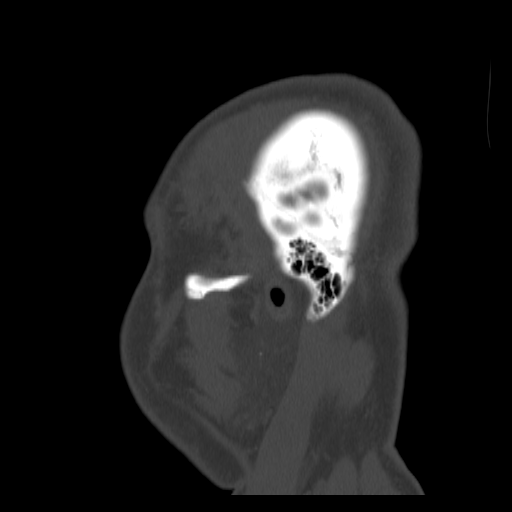
[im 99/110  brain]
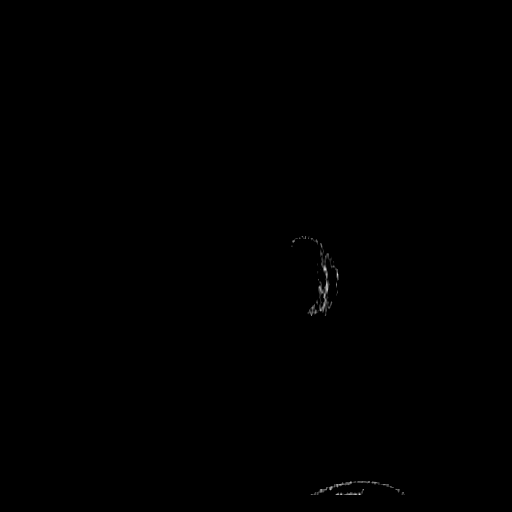
[im 99/110  bone]
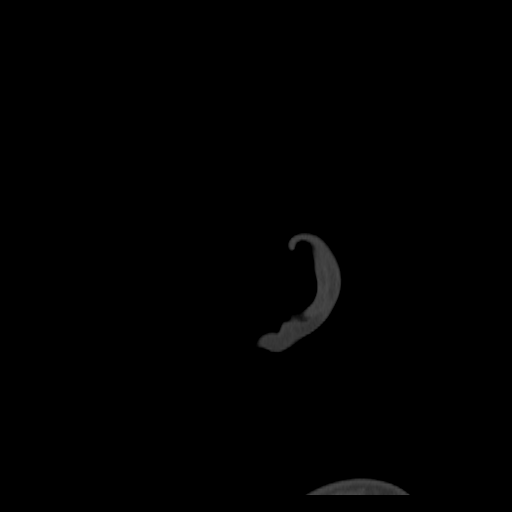

[16 of 37 positions shown; findings below may reference images not displayed]

FINDINGS: Paranasal sinuses:

Frontal: Expansive frontal sinuses. Mucosal thickening at the right
inferior outflow.

Ethmoid: Patchy mucosal thickening, mild to moderate.

Maxillary: Mild mucosal thickening with inferior lobulations likely
from retention cyst. Partial septations bilaterally.

Sphenoid: Mucosal thickening covers the sphenoid sinus ostia and
narrows the left more than right frontal ethmoidal recesses.

Right ostiomeatal unit: Patent.

Left ostiomeatal unit: Mucosal thickening narrows the infundibulum
and middle meatus. Middle meatus is narrowed due to septal deviation
and spurring.

Nasal passages: Better patency on the right. Leftward nasal septal
deviation and spurring with inferior turbinate contact

Anatomy: No pneumatization superior to anterior ethmoid notches.
Sellar sphenoid pneumatization pattern. No dehiscence of carotid or
optic canals. No onodi cell.

Other: Small bilateral parotid calculi. Negative intracranial and
orbital imaging.
IMPRESSION: 1. Moderate patchy mucosal thickening throughout the paranasal
sinuses, limiting bilateral sphenoid, right frontal, and left
maxillary outflow.
2. Leftward nasal septal spurring and deviation with asymmetric
narrowing of the left nasal cavity and middle meatus.
